# Patient Record
Sex: Female | Born: 1955 | Race: Black or African American | Hispanic: No | Marital: Married | Smoking: Light tobacco smoker
Health system: Southern US, Community
[De-identification: ages and names within clinical notes are randomized; demographics above are authoritative.]

## PROBLEM LIST (undated history)

## (undated) DIAGNOSIS — E669 Obesity, unspecified: Secondary | ICD-10-CM

## (undated) HISTORY — PX: BREAST BIOPSY: SHX20

## (undated) HISTORY — PX: TONSILLECTOMY: SUR1361

## (undated) HISTORY — PX: ABDOMINAL HYSTERECTOMY: SHX81

---

## 2008-08-11 ENCOUNTER — Ambulatory Visit: Payer: Self-pay

## 2013-03-25 HISTORY — PX: COLONOSCOPY: SHX174

## 2013-12-16 ENCOUNTER — Ambulatory Visit: Payer: Self-pay | Admitting: Otolaryngology

## 2015-08-25 ENCOUNTER — Encounter: Payer: Self-pay | Admitting: *Deleted

## 2015-08-28 ENCOUNTER — Ambulatory Visit (INDEPENDENT_AMBULATORY_CARE_PROVIDER_SITE_OTHER): Payer: 59 | Admitting: General Surgery

## 2015-08-28 ENCOUNTER — Encounter: Payer: Self-pay | Admitting: General Surgery

## 2015-08-28 VITALS — BP 122/74 | HR 80 | Resp 14 | Ht 66.0 in | Wt 179.0 lb

## 2015-08-28 DIAGNOSIS — N6452 Nipple discharge: Secondary | ICD-10-CM

## 2015-08-28 DIAGNOSIS — D249 Benign neoplasm of unspecified breast: Secondary | ICD-10-CM | POA: Insufficient documentation

## 2015-08-28 DIAGNOSIS — D242 Benign neoplasm of left breast: Secondary | ICD-10-CM | POA: Diagnosis not present

## 2015-08-28 NOTE — Patient Instructions (Addendum)
Patient to have a exision left breast .   Patient is scheduled for surgery at [facility] on 09/07/15. She will pre admit by phone. Patient is aware of date and instructions.

## 2015-08-28 NOTE — Progress Notes (Signed)
Patient ID: Melissa Phillips, female   DOB: 1955/04/20, 60 y.o.   MRN: [id number]  Chief Complaint  Patient presents with  . Other    left nipple discharge    HPI Melissa Phillips is a 60 y.o. female here today for a evaluation of left breast discharge. She states she noticed bloody discharge from her left nipple on 08/22/15. She denies any pain or discomfort from the breasts. She had her last mammogram on 06/15/15 at [facility]. She reports no past problems with her breasts. She is here today with her sister, Melissa Phillips.  The patient reports spontaneous drainage, usually at night. Multiple droplets on the bra/night clothes. She can express thin blood fluid with vigorous compression. No history of trauma. No previous history of bleeding.  I personally reviewed the patient's history. HPI  No past medical history on file.  Past Surgical History  Procedure Laterality Date  . Abdominal hysterectomy      total    Family History  Problem Relation Age of Onset  . Colon cancer Father 33  . Lung cancer Mother 77    Social History Social History  Substance Use Topics  . Smoking status: Never Smoker   . Smokeless tobacco: None  . Alcohol Use: No    No Known Allergies  Current Outpatient Prescriptions  Medication Sig Dispense Refill  . Cyanocobalamin (RA VITAMIN B-12 TR) 1000 MCG TBCR Take by mouth.    . loratadine (CLARITIN) 10 MG tablet Take 10 mg by mouth daily.    . phentermine 15 MG capsule Take 15 mg by mouth every morning.    . vitamin E (E-400) 400 UNIT capsule Take by mouth.     No current facility-administered medications for this visit.    Review of Systems Review of Systems  Constitutional: Negative.   Respiratory: Negative.   Cardiovascular: Negative.     Blood pressure 122/74, pulse 80, resp. rate 14, height 5\' 6"  (1.676 m), weight 179 lb (81.194 kg).  Physical Exam Physical Exam  Constitutional: She is oriented to person, place, and time. She appears  well-developed and well-nourished.  Eyes: Conjunctivae are normal. No scleral icterus.  Neck: Neck supple.  Cardiovascular: Normal rate, regular rhythm and normal heart sounds.   Pulmonary/Chest: Effort normal and breath sounds normal. Right breast exhibits no inverted nipple, no mass, no nipple discharge, no skin change and no tenderness. Left breast exhibits nipple discharge ( one duck at 11 oclock). Left breast exhibits no inverted nipple, no mass, no skin change and no tenderness.    Abdominal: Soft. Bowel sounds are normal. There is no tenderness.  Lymphadenopathy:    She has no cervical adenopathy.  Neurological: She is alert and oriented to person, place, and time.  Skin: Skin is warm.    Data Reviewed 06/15/2015 bilateral screening mammogram completed at [facility] showed dense breasts without area of mammographic concern. BI-RADS-1. Impressive density considering surgical menopause 20+ years ago.  Ultrasound examination of the left breast with special attention of the retroareolar area showed a 0.6 x 0.6 x 0.75 slightly elongated nodule with a central filling defect and a mixed pattern of enhancement/shadowing suggestive of a papilloma at the 2:00 position. BI-RADS 3.  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    Plan    Considering the persistent drainage rather than an incidental finding on imaging I recommended duct in the papilloma excision rather than core biopsy. Indications for surgery have been reviewed. Risks associated  with procedure including bleeding, pain and loss of nipple sensitivity were discussed.   Patient to have a excision at [facility] left breast  Patient is scheduled for surgery at [facility] on 09/07/15. She will pre admit by phone. Patient is aware of date and instructions.   PCP:  Maeola Sarah This information has been scribed by Gaspar Cola CMA.   Robert Bellow 08/28/2015, 10:03 PM

## 2015-08-28 NOTE — H&P (Signed)
HPI  Melissa Phillips is a 60 y.o. female here today for a evaluation of left breast discharge. She states she noticed bloody discharge from her left nipple on 08/22/15. She denies any pain or discomfort from the breasts. She had her last mammogram on 06/15/15 at [facility]. She reports no past problems with her breasts. She is here today with her sister, [name].  The patient reports spontaneous drainage, usually at night. Multiple droplets on the bra/night clothes. She can express thin blood fluid with vigorous compression. No history of trauma. No previous history of bleeding.  I personally reviewed the patient's history.  HPI  No past medical history on file.  Past Surgical History   Procedure  Laterality  Date   .  Abdominal hysterectomy       total    Family History   Problem  Relation  Age of Onset   .  Colon cancer  Father  55   .  Lung cancer  Mother  62    Social History  Social History   Substance Use Topics   .  Smoking status:  Never Smoker   .  Smokeless tobacco:  None   .  Alcohol Use:  No    No Known Allergies  Current Outpatient Prescriptions   Medication  Sig  Dispense  Refill   .  Cyanocobalamin (RA VITAMIN B-12 TR) 1000 MCG TBCR  Take by mouth.     .  loratadine (CLARITIN) 10 MG tablet  Take 10 mg by mouth daily.     .  phentermine 15 MG capsule  Take 15 mg by mouth every morning.     .  vitamin E (E-400) 400 UNIT capsule  Take by mouth.      No current facility-administered medications for this visit.    Review of Systems  Review of Systems  Constitutional: Negative.  Respiratory: Negative.  Cardiovascular: Negative.   Blood pressure 122/74, pulse 80, resp. rate 14, height 5\' 6"  (1.676 m), weight 179 lb (81.194 kg).  Physical Exam  Physical Exam  Constitutional: She is oriented to person, place, and time. She appears well-developed and well-nourished.  Eyes: Conjunctivae are normal. No scleral icterus.  Neck: Neck supple.  Cardiovascular: Normal  rate, regular rhythm and normal heart sounds.  Pulmonary/Chest: Effort normal and breath sounds normal. Right breast exhibits no inverted nipple, no mass, no nipple discharge, no skin change and no tenderness. Left breast exhibits nipple discharge ( one duck at 11 oclock). Left breast exhibits no inverted nipple, no mass, no skin change and no tenderness.    Abdominal: Soft. Bowel sounds are normal. There is no tenderness.  Lymphadenopathy:  She has no cervical adenopathy.  Neurological: She is alert and oriented to person, place, and time.  Skin: Skin is warm.   Data Reviewed  06/15/2015 bilateral screening mammogram completed at [facility] showed dense breasts without area of mammographic concern. BI-RADS-1. Impressive density considering surgical menopause 20+ years ago.  Ultrasound examination of the left breast with special attention of the retroareolar area showed a 0.6 x 0.6 x 0.75 slightly elongated nodule with a central filling defect and a mixed pattern of enhancement/shadowing suggestive of a papilloma at the 2:00 position. BI-RADS 3.  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   Plan   Considering the persistent drainage rather than an incidental finding on imaging I recommended duct in the papilloma excision rather than core biopsy.  Indications for surgery have  been reviewed. Risks associated with procedure including bleeding, pain and loss of nipple sensitivity were discussed.  Patient to have a excision at [facility] left breast  Patient is scheduled for surgery at [facility] on 09/07/15. She will pre admit by phone. Patient is aware of date and instructions.  PCP: Maeola Sarah  This information has been scribed by Gaspar Cola CMA.  Robert Bellow  08/28/2015, 10:03 PM

## 2015-08-30 ENCOUNTER — Encounter: Payer: Self-pay | Admitting: *Deleted

## 2015-08-30 ENCOUNTER — Other Ambulatory Visit: Payer: Self-pay | Admitting: General Surgery

## 2015-08-30 ENCOUNTER — Other Ambulatory Visit: Payer: Self-pay

## 2015-08-30 DIAGNOSIS — D242 Benign neoplasm of left breast: Secondary | ICD-10-CM

## 2015-08-30 NOTE — Patient Instructions (Signed)
  Your procedure is scheduled on: 09-07-15 Report to [place] To find out your arrival time please call [phone] between 1PM - 3PM on 09-06-15  Remember: Instructions that are not followed completely may result in serious medical risk, up to and including death, or upon the discretion of your surgeon and anesthesiologist your surgery may need to be rescheduled.    _X___ 1. Do not eat food or drink liquids after midnight. No gum chewing or hard candies.     _X___ 2. No Alcohol for 24 hours before or after surgery.   ____ 3. Bring all medications with you on the day of surgery if instructed.    ____ 4. Notify your doctor if there is any change in your medical condition     (cold, fever, infections).     Do not wear jewelry, make-up, hairpins, clips or nail polish.  Do not wear lotions, powders, or perfumes. You may wear deodorant.  Do not shave 48 hours prior to surgery. Men may shave face and neck.  Do not bring valuables to the hospital.    [facility] is not responsible for any belongings or valuables.               Contacts, dentures or bridgework may not be worn into surgery.  Leave your suitcase in the car. After surgery it may be brought to your room.  For patients admitted to the hospital, discharge time is determined by your treatment team.   Patients discharged the day of surgery will not be allowed to drive home.   Please read over the following fact sheets that you were given:     ____ Take these medicines the morning of surgery with A SIP OF WATER:    1.NONE  2.   3.   4.  5.  6.  ____ Fleet Enema (as directed)   ____ Use CHG Soap as directed  ____ Use inhalers on the day of surgery  ____ Stop metformin 2 days prior to surgery    ____ Take 1/2 of usual insulin dose the night before surgery and none on the morning of surgery.   ____ Stop Coumadin/Plavix/aspirin-N/A  ____ Stop Anti-inflammatories   ____ Stop supplements  until after surgery-STOP VITAMIN E NOW  AND PHENTERMINE-CHECK WITH [facility] C[facility] TO MAKE SURE IT IS OK TO STOP PHENTERMINE SUDDENLY SINCE YOU ARE ON A REGIMEN OF RECEIVING HCG SHOTS 2 X WEEK   ____ Bring C-Pap to the hospital.

## 2015-08-30 NOTE — H&P (Signed)
HPI Melissa Phillips is a 60 y.o. female here today for a evaluation of left breast discharge. She states she noticed bloody discharge from her left nipple on 08/22/15. She denies any pain or discomfort from the breasts. She had her last mammogram on 06/15/15 at [facility]. She reports no past problems with her breasts. She is here today with her sister, Melissa Phillips.  The patient reports spontaneous drainage, usually at night. Multiple droplets on the bra/night clothes. She can express thin blood fluid with vigorous compression. No history of trauma. No previous history of bleeding.  I personally reviewed the patient's history. HPI  No past medical history on file.  Past Surgical History  Procedure Laterality Date  . Abdominal hysterectomy      total    Family History  Problem Relation Age of Onset  . Colon cancer Father 50  . Lung cancer Mother 21    Social History Social History  Substance Use Topics  . Smoking status: Never Smoker   . Smokeless tobacco: None  . Alcohol Use: No    No Known Allergies  Current Outpatient Prescriptions  Medication Sig Dispense Refill  . Cyanocobalamin (RA VITAMIN B-12 TR) 1000 MCG TBCR Take by mouth.    . loratadine (CLARITIN) 10 MG tablet Take 10 mg by mouth daily.    . phentermine 15 MG capsule Take 15 mg by mouth every morning.    . vitamin E (E-400) 400 UNIT capsule Take by mouth.     No current facility-administered medications for this visit.    Review of Systems Review of Systems  Constitutional: Negative.  Respiratory: Negative.  Cardiovascular: Negative.    Blood pressure 122/74, pulse 80, resp. rate 14, height 5\' 6"  (1.676 m), weight 179 lb (81.194 kg).  Physical Exam Physical Exam  Constitutional: She is oriented to person, place, and time. She appears well-developed and well-nourished.  Eyes: Conjunctivae are normal. No scleral icterus.  Neck: Neck  supple.  Cardiovascular: Normal rate, regular rhythm and normal heart sounds.  Pulmonary/Chest: Effort normal and breath sounds normal. Right breast exhibits no inverted nipple, no mass, no nipple discharge, no skin change and no tenderness. Left breast exhibits nipple discharge ( one duck at 11 oclock). Left breast exhibits no inverted nipple, no mass, no skin change and no tenderness.    Abdominal: Soft. Bowel sounds are normal. There is no tenderness.  Lymphadenopathy:   She has no cervical adenopathy.  Neurological: She is alert and oriented to person, place, and time.  Skin: Skin is warm.    Data Reviewed 06/15/2015 bilateral screening mammogram completed at [place] showed dense breasts without area of mammographic concern. BI-RADS-1. Impressive density considering surgical menopause 20+ years ago.  Ultrasound examination of the left breast with special attention of the retroareolar area showed a 0.6 x 0.6 x 0.75 slightly elongated nodule with a central filling defect and a mixed pattern of enhancement/shadowing suggestive of a papilloma at the 2:00 position. BI-RADS 3.  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    Plan    Considering the persistent drainage rather than an incidental finding on imaging I recommended duct in the papilloma excision rather than core biopsy. Indications for surgery have been reviewed. Risks associated with procedure including bleeding, pain and loss of nipple sensitivity were discussed.   Patient to have a excision at [facility] left breast  Patient is scheduled for surgery at [facility] on 09/07/15. She will pre admit by phone. Patient  is aware of date and instructions.   PCP: Melissa Phillips This information has been scribed by Gaspar Cola CMA.   Robert Bellow 08/28/2015, 10:03 PM

## 2015-09-07 ENCOUNTER — Encounter
Admission: RE | Disposition: A | Discharge status: Home / Self Care | Payer: Self-pay | Source: Ambulatory Visit | Attending: General Surgery

## 2015-09-07 ENCOUNTER — Ambulatory Visit: Payer: 59 | Admitting: Anesthesiology

## 2015-09-07 ENCOUNTER — Ambulatory Visit
Admission: RE | Admit: 2015-09-07 | Discharge: 2015-09-07 | Disposition: A | Discharge status: Home / Self Care | Payer: 59 | Source: Ambulatory Visit | Attending: General Surgery | Admitting: General Surgery

## 2015-09-07 ENCOUNTER — Encounter: Payer: Self-pay | Admitting: *Deleted

## 2015-09-07 DIAGNOSIS — D242 Benign neoplasm of left breast: Secondary | ICD-10-CM | POA: Insufficient documentation

## 2015-09-07 DIAGNOSIS — N61 Mastitis without abscess: Secondary | ICD-10-CM | POA: Insufficient documentation

## 2015-09-07 DIAGNOSIS — D493 Neoplasm of unspecified behavior of breast: Secondary | ICD-10-CM | POA: Diagnosis not present

## 2015-09-07 HISTORY — PX: BREAST DUCTAL SYSTEM EXCISION: SHX5242

## 2015-09-07 HISTORY — DX: Obesity, unspecified: E66.9

## 2015-09-07 SURGERY — EXCISION DUCTAL SYSTEM BREAST
Anesthesia: General | Site: Breast | Laterality: Left | Wound class: Clean

## 2015-09-07 MED ORDER — FENTANYL CITRATE (PF) 100 MCG/2ML IJ SOLN
INTRAMUSCULAR | Status: DC | PRN
  Administered 2015-09-07: 100 ug via INTRAVENOUS

## 2015-09-07 MED ORDER — CHLORHEXIDINE GLUCONATE 4 % EX LIQD: 1.0000 "application " | Freq: Once | CUTANEOUS | Status: DC

## 2015-09-07 MED ORDER — DEXAMETHASONE SODIUM PHOSPHATE 10 MG/ML IJ SOLN
INTRAMUSCULAR | Status: DC | PRN
  Administered 2015-09-07: 5 mg via INTRAVENOUS

## 2015-09-07 MED ORDER — EPHEDRINE SULFATE 50 MG/ML IJ SOLN
INTRAMUSCULAR | Status: DC | PRN
  Administered 2015-09-07: 5 mg via INTRAVENOUS
  Administered 2015-09-07 (×3): 10 mg via INTRAVENOUS

## 2015-09-07 MED ORDER — FAMOTIDINE 20 MG PO TABS
ORAL_TABLET | ORAL | Status: DC
Start: 2015-09-07 — End: 2015-09-07
  Filled 2015-09-07: qty 1

## 2015-09-07 MED ORDER — LACTATED RINGERS IV SOLN
INTRAVENOUS | Status: DC
  Administered 2015-09-07 (×2): via INTRAVENOUS

## 2015-09-07 MED ORDER — HYDROMORPHONE HCL 1 MG/ML IJ SOLN
INTRAMUSCULAR | Status: DC | PRN
  Administered 2015-09-07: 0.5 mg via INTRAVENOUS

## 2015-09-07 MED ORDER — BUPIVACAINE-EPINEPHRINE (PF) 0.5% -1:200000 IJ SOLN
INTRAMUSCULAR | Status: DC | PRN
  Administered 2015-09-07: 30 mL

## 2015-09-07 MED ORDER — CEFAZOLIN SODIUM-DEXTROSE 2-4 GM/100ML-% IV SOLN
2.0000 g | INTRAVENOUS | Status: AC
  Administered 2015-09-07: 2 g via INTRAVENOUS

## 2015-09-07 MED ORDER — ACETAMINOPHEN 10 MG/ML IV SOLN
INTRAVENOUS | Status: AC
  Filled 2015-09-07: qty 100

## 2015-09-07 MED ORDER — FAMOTIDINE 20 MG PO TABS
20.0000 mg | ORAL_TABLET | Freq: Once | ORAL | Status: AC
  Administered 2015-09-07: 20 mg via ORAL

## 2015-09-07 MED ORDER — BUPIVACAINE-EPINEPHRINE (PF) 0.5% -1:200000 IJ SOLN
INTRAMUSCULAR | Status: AC
  Filled 2015-09-07: qty 30

## 2015-09-07 MED ORDER — LIDOCAINE HCL (CARDIAC) 20 MG/ML IV SOLN
INTRAVENOUS | Status: DC | PRN
  Administered 2015-09-07: 100 mg via INTRAVENOUS

## 2015-09-07 MED ORDER — ACETAMINOPHEN 10 MG/ML IV SOLN
INTRAVENOUS | Status: DC | PRN
  Administered 2015-09-07: 1000 mg via INTRAVENOUS

## 2015-09-07 MED ORDER — FENTANYL CITRATE (PF) 100 MCG/2ML IJ SOLN: 25.0000 ug | INTRAMUSCULAR | Status: DC | PRN

## 2015-09-07 MED ORDER — CEFAZOLIN SODIUM-DEXTROSE 2-4 GM/100ML-% IV SOLN
INTRAVENOUS | Status: AC
  Filled 2015-09-07: qty 100

## 2015-09-07 MED ORDER — METHYLENE BLUE 0.5 % INJ SOLN
INTRAVENOUS | Status: AC
  Filled 2015-09-07: qty 10

## 2015-09-07 MED ORDER — ONDANSETRON HCL 4 MG/2ML IJ SOLN: 4.0000 mg | Freq: Once | INTRAMUSCULAR | Status: DC | PRN

## 2015-09-07 MED ORDER — MIDAZOLAM HCL 2 MG/2ML IJ SOLN
INTRAMUSCULAR | Status: DC | PRN
  Administered 2015-09-07: 2 mg via INTRAVENOUS

## 2015-09-07 MED ORDER — ONDANSETRON HCL 4 MG/2ML IJ SOLN
INTRAMUSCULAR | Status: DC | PRN
  Administered 2015-09-07: 4 mg via INTRAVENOUS

## 2015-09-07 MED ORDER — KETOROLAC TROMETHAMINE 30 MG/ML IJ SOLN
INTRAMUSCULAR | Status: DC | PRN
  Administered 2015-09-07: 30 mg via INTRAVENOUS

## 2015-09-07 MED ORDER — PROPOFOL 10 MG/ML IV BOLUS
INTRAVENOUS | Status: DC | PRN
  Administered 2015-09-07: 150 mg via INTRAVENOUS
  Administered 2015-09-07: 10 mg via INTRAVENOUS

## 2015-09-07 MED ORDER — HYDROCODONE-ACETAMINOPHEN 5-325 MG PO TABS: 1.0000 | ORAL_TABLET | ORAL | Status: DC | PRN

## 2015-09-07 SURGICAL SUPPLY — 52 items
APPLIER CLIP 11 MED OPEN (CLIP)
APPLIER CLIP 13 LRG OPEN (CLIP)
BANDAGE ELASTIC 6 LF NS (GAUZE/BANDAGES/DRESSINGS) ×2 IMPLANT
BLADE SURG 15 STRL SS SAFETY (BLADE) ×2 IMPLANT
BNDG GAUZE 4.5X4.1 6PLY STRL (MISCELLANEOUS) ×2 IMPLANT
BULB RESERV EVAC DRAIN JP 100C (MISCELLANEOUS) IMPLANT
CANISTER SUCT 1200ML W/VALVE (MISCELLANEOUS) ×2 IMPLANT
CHLORAPREP W/TINT 26ML (MISCELLANEOUS) ×2 IMPLANT
CLIP APPLIE 11 MED OPEN (CLIP) IMPLANT
CLIP APPLIE 13 LRG OPEN (CLIP) IMPLANT
CNTNR SPEC 2.5X3XGRAD LEK (MISCELLANEOUS) ×2
CONT SPEC 4OZ STER OR WHT (MISCELLANEOUS) ×2
CONTAINER SPEC 2.5X3XGRAD LEK (MISCELLANEOUS) ×2 IMPLANT
DEVICE DUBIN SPECIMEN MAMMOGRA (MISCELLANEOUS) IMPLANT
DRAIN CHANNEL JP 15F RND 16 (MISCELLANEOUS) IMPLANT
DRAPE LAPAROTOMY TRNSV 106X77 (MISCELLANEOUS) ×2 IMPLANT
DRSG TELFA 3X8 NADH (GAUZE/BANDAGES/DRESSINGS) ×2 IMPLANT
ELECT CAUTERY BLADE TIP 2.5 (TIP) ×2
ELECT REM PT RETURN 9FT ADLT (ELECTROSURGICAL) ×2
ELECTRODE CAUTERY BLDE TIP 2.5 (TIP) ×1 IMPLANT
ELECTRODE REM PT RTRN 9FT ADLT (ELECTROSURGICAL) ×1 IMPLANT
GAUZE FLUFF 18X24 1PLY STRL (GAUZE/BANDAGES/DRESSINGS) ×2 IMPLANT
GAUZE SPONGE 4X4 12PLY STRL (GAUZE/BANDAGES/DRESSINGS) IMPLANT
GLOVE BIO SURGEON STRL SZ7.5 (GLOVE) ×6 IMPLANT
GLOVE INDICATOR 8.0 STRL GRN (GLOVE) ×6 IMPLANT
GOWN STRL REUS W/ TWL LRG LVL3 (GOWN DISPOSABLE) ×3 IMPLANT
GOWN STRL REUS W/TWL LRG LVL3 (GOWN DISPOSABLE) ×3
KIT RM TURNOVER STRD PROC AR (KITS) ×2 IMPLANT
LABEL OR SOLS (LABEL) ×2 IMPLANT
NEEDLE HYPO 25X1 1.5 SAFETY (NEEDLE) ×2 IMPLANT
PACK BASIN MINOR ARMC (MISCELLANEOUS) ×2 IMPLANT
PIN SAFETY STRL (MISCELLANEOUS) IMPLANT
SHEARS FOC LG CVD HARMONIC 17C (MISCELLANEOUS) IMPLANT
SLEVE PROBE SENORX GAMMA FIND (MISCELLANEOUS) IMPLANT
SPONGE LAP 18X18 5 PK (GAUZE/BANDAGES/DRESSINGS) IMPLANT
STRIP CLOSURE SKIN 1/2X4 (GAUZE/BANDAGES/DRESSINGS) ×2 IMPLANT
SUT ETHILON 3-0 FS-10 30 BLK (SUTURE) ×2
SUT SILK 0 (SUTURE)
SUT SILK 0 30XBRD TIE 6 (SUTURE) IMPLANT
SUT SILK 3 0 (SUTURE) ×1
SUT SILK 3-0 18XBRD TIE 12 (SUTURE) ×1 IMPLANT
SUT VIC AB 2-0 CT1 27 (SUTURE) ×1
SUT VIC AB 2-0 CT1 TAPERPNT 27 (SUTURE) ×1 IMPLANT
SUT VIC AB 2-0 CT2 27 (SUTURE) IMPLANT
SUT VIC AB 3-0 SH 27 (SUTURE) ×1
SUT VIC AB 3-0 SH 27X BRD (SUTURE) ×1 IMPLANT
SUT VIC AB 4-0 FS2 27 (SUTURE) ×2 IMPLANT
SUT VICRYL+ 3-0 144IN (SUTURE) ×2 IMPLANT
SUTURE EHLN 3-0 FS-10 30 BLK (SUTURE) ×1 IMPLANT
SWABSTK COMLB BENZOIN TINCTURE (MISCELLANEOUS) ×2 IMPLANT
SYR CONTROL 10ML (SYRINGE) ×2 IMPLANT
TAPE TRANSPORE STRL 2 31045 (GAUZE/BANDAGES/DRESSINGS) IMPLANT

## 2015-09-07 NOTE — Anesthesia Procedure Notes (Signed)
Procedure Name: LMA Insertion Date/Time: 09/07/2015 11:51 AM Performed by: Justus Memory Pre-anesthesia Checklist: Patient identified, Emergency Drugs available, Suction available and Patient being monitored Patient Re-evaluated:Patient Re-evaluated prior to inductionOxygen Delivery Method: Circle system utilized Preoxygenation: Pre-oxygenation with 100% oxygen Intubation Type: IV induction Ventilation: Mask ventilation without difficulty LMA: LMA inserted LMA Size: 3.5 Placement Confirmation: positive ETCO2 and breath sounds checked- equal and bilateral

## 2015-09-07 NOTE — Anesthesia Postprocedure Evaluation (Signed)
Anesthesia Post Note  Patient: Melissa Phillips  Procedure(s) Performed: Procedure(s) (LRB): EXCISION PAPILLOMA  (Left)  Patient location during evaluation: PACU Anesthesia Type: General Level of consciousness: awake and alert Pain management: pain level controlled Vital Signs Assessment: post-procedure vital signs reviewed and stable Respiratory status: spontaneous breathing and respiratory function stable Cardiovascular status: stable Anesthetic complications: no    Last Vitals:  Filed Vitals:   09/07/15 1302 09/07/15 1318  BP: 123/70 131/63  Pulse: 79 72  Temp:    Resp: 19 14    Last Pain:  Filed Vitals:   09/07/15 1319  PainSc: Asleep                 KEPHART,WILLIAM K

## 2015-09-07 NOTE — Op Note (Signed)
Preoperative diagnosis: Left breast papilloma.  Postoperative diagnosis: Same.  Operative procedure: Excision of left breast papilloma.  Operating surgeon: Ollen Bowl, M.D.  Anesthesia: Gen. by LMA, Marcaine 0.5% with 1 200,000 epinephrine, 30 mL.  Estimated blood loss: 10 mL.  Clinical note: This 60 year old woman developed left nipple drainage. Ultrasound suggested a papilloma to 2:00 position. She is brought to the operative for planned excision. She received Kefzol prior to the procedure.  Operative note: With the patient under adequate general anesthesia the area was prepped with ChloraPrep and draped. Marcaine was infiltrated for postoperative analgesia. Ultrasound was used to confirm the location of the suspected papilloma the 2:00 position just at the edge areola. The nipple duct with bloody drainage was cannulated with a thin lacrimal duct probe. A curvilinear incision from the 12:00 position was carried down through skin and subcutaneous anesthesia with hemostasis achieved by electrocautery. The tissue was dissected underneath the areola towards the nipple. The previously placed cannula was identified and the duct excised at the base the nipple. A 2 x 3 x 3 cm block of tissue was excised as well as an additional 1 cm lateral block. This was oriented and sent for routine histology. The wound was irrigated and hemostasis achieved with electrocautery. The wound was approximated with layers of 2-0 Vicryl figure-of-eight suture. The skin was closed with a running 4-0 Vicryl subcuticular suture. Benzoin, Steri-Strips, Telfa, fluff gauze, Kerlix and Ace wrap was applied.  The patient tolerated the procedure well and was taken to recovery in stable condition.

## 2015-09-07 NOTE — H&P (Signed)
No change in clinical history or exam.   For left breast papilloma excision.

## 2015-09-07 NOTE — Transfer of Care (Signed)
Immediate Anesthesia Transfer of Care Note  Patient: Melissa Phillips  Procedure(s) Performed: Procedure(s): EXCISION PAPILLOMA  (Left)  Patient Location: PACU  Anesthesia Type:General  Level of Consciousness: sedated  Airway & Oxygen Therapy: Patient Spontanous Breathing and Patient connected to face mask oxygen  Post-op Assessment: Report given to RN  Post vital signs: Reviewed and stable  Last Vitals:  Filed Vitals:   09/07/15 1041 09/07/15 1247  BP: 137/88 114/59  Pulse: 77 70  Temp: 36.7 C 36.3 C  Resp: 16 14    Last Pain:  Filed Vitals:   09/07/15 1250  PainSc: Asleep         Complications: No apparent anesthesia complications

## 2015-09-07 NOTE — Anesthesia Preprocedure Evaluation (Signed)
Anesthesia Evaluation  Patient identified by MRN, date of birth, ID band Patient awake    Reviewed: Allergy & Precautions, NPO status , Patient's Chart, lab work & pertinent test results  History of Anesthesia Complications Negative for: history of anesthetic complications  Airway Mallampati: II       Dental   Pulmonary neg pulmonary ROS,           Cardiovascular negative cardio ROS       Neuro/Psych negative neurological ROS  negative psych ROS   GI/Hepatic Neg liver ROS,   Endo/Other  negative endocrine ROS  Renal/GU negative Renal ROS     Musculoskeletal   Abdominal   Peds  Hematology negative hematology ROS (+)   Anesthesia Other Findings   Reproductive/Obstetrics                             Anesthesia Physical Anesthesia Plan  ASA: II  Anesthesia Plan: General   Post-op Pain Management:    Induction: Intravenous  Airway Management Planned: LMA  Additional Equipment:   Intra-op Plan:   Post-operative Plan:   Informed Consent: I have reviewed the patients History and Physical, chart, labs and discussed the procedure including the risks, benefits and alternatives for the proposed anesthesia with the patient or authorized representative who has indicated his/her understanding and acceptance.     Plan Discussed with:   Anesthesia Plan Comments:         Anesthesia Quick Evaluation

## 2015-09-08 ENCOUNTER — Encounter: Payer: Self-pay | Admitting: General Surgery

## 2015-09-08 LAB — SURGICAL PATHOLOGY

## 2015-09-10 ENCOUNTER — Telehealth: Payer: Self-pay | Admitting: General Surgery

## 2015-09-10 NOTE — Telephone Encounter (Signed)
Notified pathology was benign. Reports doing well. Follow up later in the week as scheduled.

## 2015-09-11 ENCOUNTER — Encounter: Payer: Self-pay | Admitting: *Deleted

## 2015-09-14 ENCOUNTER — Ambulatory Visit (INDEPENDENT_AMBULATORY_CARE_PROVIDER_SITE_OTHER): Payer: 59 | Admitting: General Surgery

## 2015-09-14 ENCOUNTER — Encounter: Payer: Self-pay | Admitting: General Surgery

## 2015-09-14 VITALS — BP 120/78 | HR 63 | Resp 14 | Ht 66.0 in | Wt 177.0 lb

## 2015-09-14 DIAGNOSIS — D242 Benign neoplasm of left breast: Secondary | ICD-10-CM

## 2015-09-14 NOTE — Patient Instructions (Signed)
Wear a bra day and night until you breast does not feel heavy.

## 2015-09-14 NOTE — Progress Notes (Signed)
Patient ID: Melissa Phillips, female   DOB: 01/01/1956, 60 y.o.   MRN: [id number]  Chief Complaint  Patient presents with  . Routine Post Op    HPI Melissa Phillips is a 60 y.o. female here for a follow up from a left breast papilloma excision done on 09/07/15. She reports that she is doing very well. No pain or discomfort.   The primary area was excised as well as a small adjacent area laterally of thickened tissue. She reports no pain since the procedure.  I person reviewed the patient's history. HPI  Past Medical History  Diagnosis Date  . Obesity     Past Surgical History  Procedure Laterality Date  . Abdominal hysterectomy      total  . Tonsillectomy    . Breast ductal system excision Left 09/07/2015    Procedure: EXCISION PAPILLOMA ;  Surgeon: Robert Bellow, MD;  Location: [facility] ORS;  Service: General;  Laterality: Left;    Family History  Problem Relation Age of Onset  . Colon cancer Father 71  . Lung cancer Mother 42    Social History Social History  Substance Use Topics  . Smoking status: Never Smoker   . Smokeless tobacco: Never Used  . Alcohol Use: No    No Known Allergies  Current Outpatient Prescriptions  Medication Sig Dispense Refill  . Cyanocobalamin (VITAMIN B-12 IJ) Inject 1 Dose as directed 2 (two) times a week. PT IS HAVING THIS DONE THRU PHYSICIAN [facility] IN COMBINATION WITH PHENTERMINE AND HCG SHOTS 2X WEEKLY    . human chorionic gonadotropin (PREGNYL/NOVAREL) 10000 units injection Inject 1 Units into the muscle 2 (two) times a week. PT HAVING THIS DONE THRU PHYSICIAN [facility] IN COMBINATION WITH PHENTERMINE AND B 12 INJECTIONS    . loratadine (CLARITIN) 10 MG tablet Take 10 mg by mouth daily.    . phentermine 37.5 MG capsule Take 37.5 mg by mouth every morning.    . vitamin E (E-400) 400 UNIT capsule Take by mouth.     No current facility-administered medications for this visit.    Review of Systems Review of Systems   Constitutional: Negative.   Respiratory: Negative.   Cardiovascular: Negative.     Blood pressure 120/78, pulse 63, resp. rate 14, height 5\' 6"  (1.676 m), weight 177 lb (80.287 kg).  Physical Exam Physical Exam  Constitutional: She appears well-developed and well-nourished.  Pulmonary/Chest: Left breast exhibits no nipple discharge and no tenderness.      Data Reviewed DIAGNOSIS:  A. 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  - BACKGROUND FIBROCYSTIC CHANGE AND CHRONIC INFLAMMATION.   B. LEFT BREAST, LATERAL TISSUE; EXCISION:  - FOCAL FIBROCYSTIC CHANGE AND CHRONIC INFLAMMATION.   Assessment    Doing well status post excision left breast papilloma.    Plan    The patient has been asked to return in one month for final exam. She will continue to wear her bra day and night until the heaviness in the breast resolves.     This has been scribed by Lesly Rubenstein LPN PCP: Lanice Shirts 09/14/2015, 1:52 PM

## 2015-10-11 ENCOUNTER — Ambulatory Visit (INDEPENDENT_AMBULATORY_CARE_PROVIDER_SITE_OTHER): Payer: 59 | Admitting: General Surgery

## 2015-10-11 DIAGNOSIS — D242 Benign neoplasm of left breast: Secondary | ICD-10-CM

## 2015-10-11 NOTE — Progress Notes (Signed)
Patient ID: Melissa Phillips, female   DOB: 1955-06-12, 60 y.o.   MRN: [id number]  Chief Complaint  Patient presents with  . Follow-up    excision left breast    HPI [name] is a 60 y.o. female here for a follow up from a left breast papilloma excision done on 09/07/15. She reports that she is doing very well. No pain or discomfort.  HPI  Past Medical History  Diagnosis Date  . Obesity     Past Surgical History  Procedure Laterality Date  . Abdominal hysterectomy      total  . Tonsillectomy    . Breast ductal system excision Left 09/07/2015    Procedure: EXCISION PAPILLOMA ;  Surgeon: Robert Bellow, MD;  Location: [facility] ORS;  Service: General;  Laterality: Left;    Family History  Problem Relation Age of Onset  . Colon cancer Father 57  . Lung cancer Mother 77    Social History Social History  Substance Use Topics  . Smoking status: Never Smoker   . Smokeless tobacco: Never Used  . Alcohol Use: No    No Known Allergies  Current Outpatient Prescriptions  Medication Sig Dispense Refill  . Cyanocobalamin (VITAMIN B-12 IJ) Inject 1 Dose as directed 2 (two) times a week. PT IS HAVING THIS DONE THRU PHYSICIAN WEIGHT LOSS IN COMBINATION WITH PHENTERMINE AND HCG SHOTS 2X WEEKLY    . human chorionic gonadotropin (PREGNYL/NOVAREL) 10000 units injection Inject 1 Units into the muscle 2 (two) times a week. PT HAVING THIS DONE THRU PHYSICIAN WEIGHT LOSS IN COMBINATION WITH PHENTERMINE AND B 12 INJECTIONS    . loratadine (CLARITIN) 10 MG tablet Take 10 mg by mouth daily.    . phentermine 37.5 MG capsule Take 37.5 mg by mouth every morning.    . vitamin E (E-400) 400 UNIT capsule Take by mouth.     No current facility-administered medications for this visit.    Review of Systems Review of Systems  Constitutional: Negative.   Respiratory: Negative.   Cardiovascular: Negative.     There were no vitals taken for this visit.  Physical Exam Physical Exam   Constitutional: She appears well-developed and well-nourished.  Eyes: Conjunctivae are normal. No scleral icterus.  Neck: Neck supple.  Cardiovascular: Normal rate, regular rhythm and normal heart sounds.   Pulmonary/Chest: Effort normal and breath sounds normal.  Abdominal: Soft. Bowel sounds are normal.  Lymphadenopathy:    She has no cervical adenopathy.  Neurological: She is alert.  Skin: Skin is warm and dry.      Assessment    Aborted visit secondary to building evacuation.    Plan    Reschedule    PCP:  [place] This information has been scribed by Gaspar Cola CMA.    Gaspar Cola 10/11/2015, 9:37 AM

## 2015-10-19 ENCOUNTER — Ambulatory Visit (INDEPENDENT_AMBULATORY_CARE_PROVIDER_SITE_OTHER): Payer: 59 | Admitting: General Surgery

## 2015-10-19 ENCOUNTER — Encounter: Payer: Self-pay | Admitting: General Surgery

## 2015-10-19 VITALS — BP 120/72 | HR 78 | Resp 12 | Ht 66.0 in | Wt 176.0 lb

## 2015-10-19 DIAGNOSIS — D242 Benign neoplasm of left breast: Secondary | ICD-10-CM

## 2015-10-19 NOTE — Progress Notes (Signed)
Patient ID: Melissa Phillips, female   DOB: 1955-12-27, 60 y.o.   MRN: [id number]  Chief Complaint  Patient presents with  . Routine Post Op    left breast excision     HPI Melissa Phillips is a 60 y.o. female.  Here today for follow up visit, excision papilloma left breast 09-07-15. She states she is doing well.   HPI  Past Medical History:  Diagnosis Date  . Obesity     Past Surgical History:  Procedure Laterality Date  . ABDOMINAL HYSTERECTOMY     total  . BREAST DUCTAL SYSTEM EXCISION Left 09/07/2015   Procedure: EXCISION PAPILLOMA ;  Surgeon: Robert Bellow, MD;  Location: [facility] ORS;  Service: General;  Laterality: Left;  . COLONOSCOPY  2015  . TONSILLECTOMY      Family History  Problem Relation Age of Onset  . Colon cancer Father 6  . Lung cancer Mother 77    Social History Social History  Substance Use Topics  . Smoking status: Never Smoker  . Smokeless tobacco: Never Used  . Alcohol use No    No Known Allergies  Current Outpatient Prescriptions  Medication Sig Dispense Refill  . Cyanocobalamin (VITAMIN B-12 IJ) Inject 1 Dose as directed 2 (two) times a week. PT IS HAVING THIS DONE THRU PHYSICIAN WEIGHT LOSS IN COMBINATION WITH PHENTERMINE AND HCG SHOTS 2X WEEKLY    . human chorionic gonadotropin (PREGNYL/NOVAREL) 10000 units injection Inject 1 Units into the muscle 2 (two) times a week. PT HAVING THIS DONE THRU PHYSICIAN WEIGHT LOSS IN COMBINATION WITH PHENTERMINE AND B 12 INJECTIONS    . loratadine (CLARITIN) 10 MG tablet Take 10 mg by mouth daily.    . phentermine 37.5 MG capsule Take 37.5 mg by mouth every morning.    . vitamin E (E-400) 400 UNIT capsule Take by mouth.     No current facility-administered medications for this visit.     Review of Systems Review of Systems  Constitutional: Negative.   Respiratory: Negative.   Cardiovascular: Negative.     Blood pressure 120/72, pulse 78, resp. rate 12, height 5\' 6"  (1.676 m), weight 176 lb  (79.8 kg).  Physical Exam Physical Exam  Constitutional: She is oriented to person, place, and time. She appears well-developed and well-nourished.  Pulmonary/Chest:    Left breast incision well healed.  Neurological: She is alert and oriented to person, place, and time.  Skin: Skin is warm and dry.  Psychiatric: Her behavior is normal.    Data Reviewed Papilloma without atypia.  Assessment    Resolution of nipple drainage with excision of intraductal papilloma.    Plan         Follow up as needed. Annual mammograms through primary care physician. The patient is aware to call back for any questions or concerns.      This information has been scribed by Karie Fetch RN, BSN,BC.   Robert Bellow 10/19/2015, 10:09 AM

## 2015-10-19 NOTE — Patient Instructions (Signed)
The patient is aware to call back for any questions or concerns.  

## 2017-09-08 ENCOUNTER — Ambulatory Visit: Payer: 59 | Admitting: Obstetrics and Gynecology

## 2017-09-30 ENCOUNTER — Ambulatory Visit (INDEPENDENT_AMBULATORY_CARE_PROVIDER_SITE_OTHER): Payer: 59 | Admitting: Obstetrics and Gynecology

## 2017-09-30 ENCOUNTER — Encounter: Payer: Self-pay | Admitting: Obstetrics and Gynecology

## 2017-09-30 VITALS — BP 136/84 | HR 69 | Ht 66.0 in | Wt 184.0 lb

## 2017-09-30 DIAGNOSIS — K640 First degree hemorrhoids: Secondary | ICD-10-CM | POA: Diagnosis not present

## 2017-09-30 DIAGNOSIS — Z0000 Encounter for general adult medical examination without abnormal findings: Secondary | ICD-10-CM

## 2017-09-30 DIAGNOSIS — Z13820 Encounter for screening for osteoporosis: Secondary | ICD-10-CM

## 2017-09-30 DIAGNOSIS — Z1231 Encounter for screening mammogram for malignant neoplasm of breast: Secondary | ICD-10-CM

## 2017-09-30 DIAGNOSIS — Z124 Encounter for screening for malignant neoplasm of cervix: Secondary | ICD-10-CM

## 2017-09-30 DIAGNOSIS — Z1239 Encounter for other screening for malignant neoplasm of breast: Secondary | ICD-10-CM

## 2017-09-30 DIAGNOSIS — Z01411 Encounter for gynecological examination (general) (routine) with abnormal findings: Secondary | ICD-10-CM | POA: Diagnosis not present

## 2017-09-30 DIAGNOSIS — Z01419 Encounter for gynecological examination (general) (routine) without abnormal findings: Secondary | ICD-10-CM

## 2017-09-30 NOTE — Progress Notes (Signed)
Gynecology Annual Exam  PCP: Maeola Sarah, MD  Chief Complaint:  Chief Complaint  Patient presents with  . Gynecologic Exam    Hemorrhoids    History of Present Illness:Patient is a 62 y.o. [id number] presents for annual exam. The patient has no complaints today.   LMP: No LMP recorded. Patient has had a hysterectomy. Intermenstrual Bleeding: not applicable Postcoital Bleeding: no Dysmenorrhea: not applicable  The patient is sexually active. She denies dyspareunia.  The patient does perform self breast exams.  There is no notable family history of breast or ovarian cancer in her family.  The patient wears seatbelts: yes.   The patient has regular exercise: yes.    The patient denies current symptoms of depression.     Review of Systems: Review of Systems  Constitutional: Negative for chills, fever, malaise/fatigue and weight loss.  HENT: Negative for congestion, hearing loss and sinus pain.   Eyes: Negative for blurred vision and double vision.  Respiratory: Negative for cough, sputum production, shortness of breath and wheezing.   Cardiovascular: Negative for chest pain, palpitations, orthopnea and leg swelling.  Gastrointestinal: Negative for abdominal pain, constipation, diarrhea, nausea and vomiting.  Genitourinary: Negative for dysuria, flank pain, frequency, hematuria and urgency.  Musculoskeletal: Negative for back pain, falls and joint pain.  Skin: Negative for itching and rash.  Neurological: Negative for dizziness and headaches.  Psychiatric/Behavioral: Negative for depression, substance abuse and suicidal ideas. The patient is not nervous/anxious.     Past Medical History:  Past Medical History:  Diagnosis Date  . Obesity     Past Surgical History:  Past Surgical History:  Procedure Laterality Date  . ABDOMINAL HYSTERECTOMY     total  . BREAST DUCTAL SYSTEM EXCISION Left 09/07/2015   Procedure: EXCISION PAPILLOMA ;  Surgeon: Robert Bellow, MD;   Location: [facility] ORS;  Service: General;  Laterality: Left;  . COLONOSCOPY  2015  . TONSILLECTOMY      Gynecologic History:  No LMP recorded. Patient has had a hysterectomy. Last Pap: Results were: NIL no abnormalities  Last mammogram: 2017 Results were: BI-RAD I  Obstetric History: [id number]  Family History:  Family History  Problem Relation Age of Onset  . Colon cancer Father 68  . Lung cancer Mother 101  . Lung cancer Sister     Social History:  Social History   Socioeconomic History  . Marital status: Married    Spouse name: Not on file  . Number of children: Not on file  . Years of education: Not on file  . Highest education level: Not on file  Occupational History  . Not on file  Social Needs  . Financial resource strain: Not on file  . Food insecurity:    Worry: Not on file    Inability: Not on file  . Transportation needs:    Medical: Not on file    Non-medical: Not on file  Tobacco Use  . Smoking status: Light Tobacco Smoker  . Smokeless tobacco: Never Used  Substance and Sexual Activity  . Alcohol use: No    Alcohol/week: 0.0 oz  . Drug use: No  . Sexual activity: Yes    Birth control/protection: None  Lifestyle  . Physical activity:    Days per week: Not on file    Minutes per session: Not on file  . Stress: Not on file  Relationships  . Social connections:    Talks on phone: Not on file    Gets together:  Not on file    Attends religious service: Not on file    Active member of club or organization: Not on file    Attends meetings of clubs or organizations: Not on file    Relationship status: Not on file  . Intimate partner violence:    Fear of current or ex partner: Not on file    Emotionally abused: Not on file    Physically abused: Not on file    Forced sexual activity: Not on file  Other Topics Concern  . Not on file  Social History Narrative  . Not on file    Allergies:  No Known Allergies  Medications: Prior to Admission  medications   Medication Sig Start Date End Date Taking? Authorizing Provider  Cyanocobalamin (VITAMIN B-12 IJ) Inject 1 Dose as directed 2 (two) times a week. PT IS HAVING THIS DONE THRU PHYSICIAN WEIGHT LOSS IN COMBINATION WITH PHENTERMINE AND HCG SHOTS 2X WEEKLY   Yes [provider]  loratadine (CLARITIN) 10 MG tablet Take 10 mg by mouth daily.   Yes [provider]  TOVIAZ 4 MG TB24 tablet TAKE 1 TABLET BY MOUTH EVERY DAY IN THE MORNING 09/15/17  Yes [provider]  valACYclovir (VALTREX) 1000 MG tablet Take by mouth.   Yes [provider]  vitamin E (E-400) 400 UNIT capsule Take by mouth.   Yes [provider]    Physical Exam Vitals: Blood pressure 136/84, pulse 69, height 5\' 6"  (1.676 m), weight 184 lb (83.5 kg).  General: NAD HEENT: normocephalic, anicteric Thyroid: no enlargement, no palpable nodules Pulmonary: No increased work of breathing, CTAB Cardiovascular: RRR, distal pulses 2+ Breast: Breast symmetrical, no tenderness, no palpable nodules or masses, no skin or nipple retraction present, no nipple discharge.  No axillary or supraclavicular lymphadenopathy. Abdomen: NABS, soft, non-tender, non-distended.  Umbilicus without lesions.  No hepatomegaly, splenomegaly or masses palpable. No evidence of hernia  Genitourinary:  External: Normal external female genitalia.  Normal urethral meatus, normal Bartholin's and Skene's glands.    Vagina: Normal vaginal mucosa, no evidence of prolapse.    Cervix:surgically absent  Uterus: surgically absent    Adnexa: ovaries non-enlarged, no adnexal masses  Rectal: deferred  Lymphatic: no evidence of inguinal lymphadenopathy Extremities: no edema, erythema, or tenderness Neurologic: Grossly intact Psychiatric: mood appropriate, affect full  Female chaperone present for pelvic and breast  portions of the physical exam     Assessment: 62 y.o. [id number] routine annual exam  Plan: Problem List  Items Addressed This Visit    None    Visit Diagnoses    Health care maintenance    -  Primary   Visit for pelvic exam       Encounter for screening for cervical cancer        Screening breast examination       Screening for breast cancer       Screening for osteoporosis          1) Mammogram - recommend yearly screening mammogram.  Mammogram Was ordered today  2) STI screening  was offered and declined  3) ASCCP guidelines and rational discussed.  Patient opts for discontinue screening interval since her cervix was removed with her hysterectomy and she has never had an abnormal pap smear.   4) Osteoporosis  - per USPTF routine screening DEXA at age 49 - FRAX 36 year major fracture risk 13%,  10 year hip fracture risk 1.5%  Consider FDA-approved medical therapies in postmenopausal women  and men aged 19 years and older, based on the following: a) A hip or vertebral (clinical or morphometric) fracture b) T-score ? -2.5 at the femoral neck or spine after appropriate evaluation to exclude secondary causes C) Low bone mass (T-score between -1.0 and -2.5 at the femoral neck or spine) and a 10-year probability of a hip fracture ? 3% or a 10-year probability of a major osteoporosis-related fracture ? 20% based on the [place]-adapted WHO algorithm   5) Routine healthcare maintenance including cholesterol, diabetes screening discussed managed by PCP  6) Colonoscopy last performed in 2016 according to [facility]. She does not remember when they asked her to follow up. She can not recall the name of the place were the colonoscopy was performed.  Screening recommended starting at age 71 for average risk individuals, age 77 for individuals deemed at increased risk (including African Americans) and recommended to continue until age 31.  For patient age 33-85 individualized approach is recommended.  Gold standard screening is via colonoscopy, Cologuard screening is an acceptable alternative for patient  unwilling or unable to undergo colonoscopy.  "Colorectal cancer screening for average?risk adults: 2018 guideline update from the American Cancer Society"CA: A Cancer Journal for Clinicians: Aug 21, 2016   7) Patient requests referral to general surgery for management of hemorrhoids.   8) Return in about 1 year (around 10/01/2018) for annual.   Adrian Prows MD [place] 09/30/17 2:13 PM

## 2018-02-04 ENCOUNTER — Telehealth: Payer: Self-pay

## 2018-02-04 ENCOUNTER — Other Ambulatory Visit: Payer: Self-pay | Admitting: Obstetrics and Gynecology

## 2018-02-04 DIAGNOSIS — A6009 Herpesviral infection of other urogenital tract: Secondary | ICD-10-CM

## 2018-02-04 MED ORDER — VALACYCLOVIR HCL 1 G PO TABS: 1000.0000 mg | ORAL_TABLET | Freq: Every day | ORAL | 11 refills | Status: AC

## 2018-02-04 NOTE — Progress Notes (Signed)
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   Adrian Prows MD [place], [facility] 02/04/18 1:35 PM

## 2018-02-04 NOTE — Telephone Encounter (Signed)
Pt saw [facility] for AE 09/2017 & thought she mentioned that she had HSV. She usually gets a rx at her physical. She has checked with her pharmacy & there isn't a rx there. She is requesting a rx for Valtrex. Also, she hasn't been for her mammogram yet & is inquiring how she should go about getting that scheduled. PO#[phone]

## 2018-02-04 NOTE — Telephone Encounter (Signed)
I sent a refill for valtrex suppressive therapy. This is a request 4 months after her visit which is a little unusual. The front desk has the phone number of the imaging centers that she can call to schedule a mammogram. The mammogram is ordered. Please call her and let her know the phone number and that I sent a prescription for her.  Thank you,  Dr. Gilman Schmidt

## 2018-02-04 NOTE — Telephone Encounter (Signed)
LMVM to notify pt that rx has been sent in. Given #for [facility] [phone] to schedule mammogram. Patient advised to contact us back if she wishes to schedule her mammogram at a facility other than [facility].

## 2019-07-16 ENCOUNTER — Ambulatory Visit: Payer: 59 | Admitting: Obstetrics and Gynecology

## 2019-08-06 ENCOUNTER — Ambulatory Visit (INDEPENDENT_AMBULATORY_CARE_PROVIDER_SITE_OTHER): Payer: 59 | Admitting: Obstetrics and Gynecology

## 2019-08-06 ENCOUNTER — Encounter: Payer: Self-pay | Admitting: Obstetrics and Gynecology

## 2019-08-06 ENCOUNTER — Other Ambulatory Visit: Payer: Self-pay

## 2019-08-06 VITALS — BP 100/66 | Ht 66.0 in | Wt 175.0 lb

## 2019-08-06 DIAGNOSIS — Z124 Encounter for screening for malignant neoplasm of cervix: Secondary | ICD-10-CM

## 2019-08-06 DIAGNOSIS — Z1239 Encounter for other screening for malignant neoplasm of breast: Secondary | ICD-10-CM

## 2019-08-06 DIAGNOSIS — Z1231 Encounter for screening mammogram for malignant neoplasm of breast: Secondary | ICD-10-CM

## 2019-08-06 DIAGNOSIS — Z1211 Encounter for screening for malignant neoplasm of colon: Secondary | ICD-10-CM

## 2019-08-06 DIAGNOSIS — Z0000 Encounter for general adult medical examination without abnormal findings: Secondary | ICD-10-CM

## 2019-08-06 NOTE — Progress Notes (Signed)
Gynecology Annual Exam  PCP: Maeola Sarah, MD  Chief Complaint:  Chief Complaint  Patient presents with  . Gynecologic Exam    History of Present Illness:Patient is a 64 y.o. [id number] presents for annual exam. The patient has no complaints today.   LMP: No LMP recorded. Patient has had a hysterectomy. Denies vaginal bleeding  The patient is sexually active. She denies dyspareunia.  The patient does not perform self breast exams.  There is no notable family history of breast or ovarian cancer in her family.  The patient wears seatbelts: yes.   The patient has regular exercise: not asked.    The patient denies current symptoms of depression.   Reports using marijuana at night to help with falling asleep.  Review of Systems: Review of Systems  Constitutional: Negative for chills, fever, malaise/fatigue and weight loss.  HENT: Positive for congestion. Negative for hearing loss and sinus pain.   Eyes: Negative for blurred vision and double vision.  Respiratory: Negative for cough, sputum production, shortness of breath and wheezing.   Cardiovascular: Negative for chest pain, palpitations, orthopnea and leg swelling.  Gastrointestinal: Positive for constipation. Negative for abdominal pain, diarrhea, nausea and vomiting.  Genitourinary: Positive for frequency. Negative for dysuria, flank pain, hematuria and urgency.  Musculoskeletal: Negative for back pain, falls and joint pain.  Skin: Negative for itching and rash.  Neurological: Negative for dizziness and headaches.  Psychiatric/Behavioral: Negative for depression, substance abuse and suicidal ideas. The patient is not nervous/anxious.     Past Medical History:  Patient Active Problem List   Diagnosis Date Noted  . Papilloma of breast 08/28/2015    Past Surgical History:  Past Surgical History:  Procedure Laterality Date  . ABDOMINAL HYSTERECTOMY     total  . BREAST DUCTAL SYSTEM EXCISION Left 09/07/2015   Procedure:  EXCISION PAPILLOMA ;  Surgeon: Robert Bellow, MD;  Location: [facility] ORS;  Service: General;  Laterality: Left;  . COLONOSCOPY  2015  . TONSILLECTOMY      Gynecologic History:  No LMP recorded. Patient has had a hysterectomy. Last Pap: Results were: normal Last mammogram: 2017 Results were: BI-RAD I  Obstetric [id number]  Family History:  Family History  Problem Relation Age of Onset  . Colon cancer Father 46  . Lung cancer Mother 2  . Lung cancer Sister     Social History:  Social History   Socioeconomic History  . Marital status: Married    Spouse name: Not on file  . Number of children: Not on file  . Years of education: Not on file  . Highest education level: Not on file  Occupational History  . Not on file  Tobacco Use  . Smoking status: Light Tobacco Smoker  . Smokeless tobacco: Never Used  Substance and Sexual Activity  . Alcohol use: No    Alcohol/week: 0.0 standard drinks  . Drug use: No  . Sexual activity: Yes    Birth control/protection: None  Other Topics Concern  . Not on file  Social History Narrative  . Not on file   Social Determinants of Health   Financial Resource Strain:   . Difficulty of Paying Living Expenses:   Food Insecurity:   . Worried About Charity fundraiser in the Last Year:   . Arboriculturist in the Last Year:   Transportation Needs:   . Film/video editor (Medical):   Marland Kitchen Lack of Transportation (Non-Medical):   Physical Activity:   .  Days of Exercise per Week:   . Minutes of Exercise per Session:   Stress:   . Feeling of Stress :   Social Connections:   . Frequency of Communication with Friends and Family:   . Frequency of Social Gatherings with Friends and Family:   . Attends Religious Services:   . Active Member of Clubs or Organizations:   . Attends Archivist Meetings:   Marland Kitchen Marital Status:   Intimate Partner Violence:   . Fear of Current or Ex-Partner:   . Emotionally Abused:   Marland Kitchen Physically  Abused:   . Sexually Abused:     Allergies:  No Known Allergies  Medications: Prior to Admission medications   Medication Sig Start Date End Date Taking? Authorizing Provider  cetirizine (ZYRTEC) 10 MG tablet Take by mouth.   Yes [provider]  fluticasone (FLONASE) 50 MCG/ACT nasal spray Flonase 50 mcg/actuation nasal spray,suspension  Spray 1 spray every day by intranasal route.   Yes [provider]  TOVIAZ 4 MG TB24 tablet TAKE 1 TABLET BY MOUTH EVERY DAY IN THE MORNING 09/15/17  Yes [provider]  valACYclovir (VALTREX) 1000 MG tablet as needed 03/03/18  Yes [provider]  vitamin E (E-400) 400 UNIT capsule Take by mouth.   Yes [provider]    Physical Exam Vitals: Blood pressure 100/66, height 5\' 6"  (1.676 m), weight 175 lb (79.4 kg).  General: NAD HEENT: normocephalic, anicteric Thyroid: no enlargement, no palpable nodules Pulmonary: No increased work of breathing, CTAB Cardiovascular: RRR, distal pulses 2+ Breast: Breast symmetrical, no tenderness, no palpable nodules or masses, no skin or nipple retraction present, no nipple discharge.  No axillary or supraclavicular lymphadenopathy. Abdomen: NABS, soft, non-tender, non-distended.  Umbilicus without lesions.  No hepatomegaly, splenomegaly or masses palpable. No evidence of hernia  Genitourinary:  External: Normal external female genitalia.  Normal urethral meatus, normal Bartholin's and Skene's glands.    Vagina: Normal vaginal mucosa, no evidence of prolapse.    Cervix: Grossly normal in appearance, no bleeding  Uterus: Non-enlarged, mobile, normal contour.  No CMT  Adnexa: ovaries non-enlarged, no adnexal masses  Rectal: deferred  Lymphatic: no evidence of inguinal lymphadenopathy Extremities: no edema, erythema, or tenderness Neurologic: Grossly intact Psychiatric: mood appropriate, affect full  Female chaperone present for pelvic and breast  portions of the  physical exam     Assessment: 64 y.o. [id number] routine annual exam  Plan: Problem List Items Addressed This Visit    None    Visit Diagnoses    Cervical cancer screening    -  Primary   Health maintenance examination       Encounter for screening mammogram for breast cancer       Relevant Orders   MM 3D SCREEN BREAST BILATERAL   Encounter for screening for cervical cancer        Screening breast examination       Colon cancer screening       Relevant Orders   Ambulatory referral to Gastroenterology      1) Mammogram - recommend yearly screening mammogram.  Mammogram Was ordered today  2) STI screening  was not offered and therefore not obtained  3) ASCCP guidelines and rational discussed.  Patient opts for discontinue secondary to prior hysterectomy screening interval  4) Osteoporosis  - per USPTF routine screening DEXA at age 86 - FRAX 30 year major fracture risk 7.6,  10 year hip fracture risk 0.7  Consider FDA-approved medical therapies  in postmenopausal women and men aged 82 years and older, based on the following: a) A hip or vertebral (clinical or morphometric) fracture b) T-score ? -2.5 at the femoral neck or spine after appropriate evaluation to exclude secondary causes C) Low bone mass (T-score between -1.0 and -2.5 at the femoral neck or spine) and a 10-year probability of a hip fracture ? 3% or a 10-year probability of a major osteoporosis-related fracture ? 20% based on the [place]-adapted WHO algorithm  5) Routine healthcare maintenance including cholesterol, diabetes screening discussed managed by PCP  6) Colonoscopy last performed 2016. Patient reports having polyps and a family hx of colon cancer. Referral to repeat colonoscopy placed.   7) Return in about 1 year (around 08/05/2020) for annual.   Adrian Prows MD [place], [facility] 08/06/2019 10:23 AM

## 2019-08-06 NOTE — Patient Instructions (Signed)
[employer] Recommended Dietary Allowances for Calcium and Vitamin D  Age (yr) Calcium Recommended Dietary Allowance (mg/day) Vitamin D Recommended Dietary Allowance (international units/day)  9-18 1,300 600  19-50 1,000 600  51-70 1,200 600  71 and older 1,200 800  Data from [employer]. Dietary reference intakes: calcium, vitamin D. [place], [place]: [employer]; 2011.    Budget-Friendly Healthy Eating There are many ways to save money at the grocery store and continue to eat healthy. You can be successful if you:  Plan meals according to your budget.  Make a grocery list and only purchase food according to your grocery list.  Prepare food yourself. What are tips for following this plan?  Reading food labels  Compare food labels between brand name foods and the store brand. Often the nutritional value is the same, but the store brand is lower cost.  Look for products that do not have added sugar, fat, or salt (sodium). These often cost the same but are healthier for you. Products may be labeled as: ? Sugar-free. ? Nonfat. ? Low-fat. ? Sodium-free. ? Low-sodium.  Look for lean ground beef labeled as at least 92% lean and 8% fat. Shopping  Buy only the items on your grocery list and go only to the areas of the store that have the items on your list.  Use coupons only for foods and brands you normally buy. Avoid buying items you wouldn't normally buy simply because they are on sale.  Check online and in newspapers for weekly deals.  Buy healthy items from the bulk bins when available, such as herbs, spices, flour, pasta, nuts, and dried fruit.  Buy fruits and vegetables that are in season. Prices are usually lower on in-season produce.  Look at the unit price on the price tag. Use it to compare different brands and sizes to find out which item is the best deal.  Choose healthy items that are often low-cost, such as carrots,  potatoes, apples, bananas, and oranges. Dried or canned beans are a low-cost protein source.  Buy in bulk and freeze extra food. Items you can buy in bulk include meats, fish, poultry, frozen fruits, and frozen vegetables.  Avoid buying "ready-to-eat" foods, such as pre-cut fruits and vegetables and pre-made salads.  If possible, shop around to discover where you can find the best prices. Consider other retailers such as dollar stores, larger wholesale stores, local fruit and vegetable stands, and farmers markets.  Do not shop when you are hungry. If you shop while hungry, it may be hard to stick to your list and budget.  Resist impulse buying. Use your grocery list as your official plan for the week.  Buy a variety of vegetables and fruits by purchasing fresh, frozen, and canned items.  Look at the top and bottom shelves for deals. Foods at eye level (eye level of an adult or child) are usually more expensive.  Be efficient with your time when shopping. The more time you spend at the store, the more money you are likely to spend.  To save money when choosing more expensive foods like meats and dairy: ? Choose cheaper cuts of meat, such as bone-in chicken thighs and drumsticks instead of skinless and boneless chicken. When you are ready to prepare the chicken, you can remove the skin yourself to make it healthier. ? Choose lean meats like chicken or turkey instead of beef. ? Choose canned seafood, such as tuna, salmon, or sardines. ? Buy eggs   as a low-cost source of protein. ? Buy dried beans and peas, such as lentils, split peas, or kidney beans instead of meats. Dried beans and peas are a good alternative source of protein. ? Buy the larger tubs of yogurt instead of individual-sized containers.  Choose water instead of sodas and other sweetened beverages.  Avoid buying chips, cookies, and other "junk food." These items are usually expensive and not healthy. Cooking  Make extra food  and freeze the extras in meal-sized containers or in individual portions for fast meals and snacks.  Pre-cook on days when you have extra time to prepare meals in advance. You can keep these meals in the fridge or freezer and reheat for a quick meal.  When you come home from the grocery store, wash, peel, and cut fruits and vegetables so they are ready to use and eat. This will help reduce food waste. Meal planning  Do not eat out or get fast food. Prepare food at home.  Make a grocery list and make sure to bring it with you to the store. If you have a smart phone, you could use your phone to create your shopping list.  Plan meals and snacks according to a grocery list and budget you create.  Use leftovers in your meal plan for the week.  Look for recipes where you can cook once and make enough food for two meals.  Include budget-friendly meals like stews, casseroles, and stir-fry dishes.  Try some meatless meals or try "no cook" meals like salads.  Make sure that half your plate is filled with fruits or vegetables. Choose from fresh, frozen, or canned fruits and vegetables. If eating canned, remember to rinse them before eating. This will remove any excess salt added for packaging. Summary  Eating healthy on a budget is possible if you plan your meals according to your budget, purchase according to your budget and grocery list, and prepare food yourself.  Tips for buying more food on a limited budget include buying generic brands, using coupons only for foods you normally buy, and buying healthy items from the bulk bins when available.  Tips for buying cheaper food to replace expensive food include choosing cheaper, lean cuts of meat, and buying dried beans and peas. This information is not intended to replace advice given to you by your health care provider. Make sure you discuss any questions you have with your health care provider. Document Revised: 03/12/2017 Document Reviewed:  03/12/2017 Elsevier Patient Education  2020 [employer].   Exercising to Stay Healthy To become healthy and stay healthy, it is recommended that you do moderate-intensity and vigorous-intensity exercise. You can tell that you are exercising at a moderate intensity if your heart starts beating faster and you start breathing faster but can still hold a conversation. You can tell that you are exercising at a vigorous intensity if you are breathing much harder and faster and cannot hold a conversation while exercising. Exercising regularly is important. It has many health benefits, such as:  Improving overall fitness, flexibility, and endurance.  Increasing bone density.  Helping with weight control.  Decreasing body fat.  Increasing muscle strength.  Reducing stress and tension.  Improving overall health. How often should I exercise? Choose an activity that you enjoy, and set realistic goals. Your health care provider can help you make an activity plan that works for you. Exercise regularly as told by your health care provider. This may include:  Doing strength training two times a   week, such as: ? Lifting weights. ? Using resistance bands. ? Push-ups. ? Sit-ups. ? Yoga.  Doing a certain intensity of exercise for a given amount of time. Choose from these options: ? A total of 150 minutes of moderate-intensity exercise every week. ? A total of 75 minutes of vigorous-intensity exercise every week. ? A mix of moderate-intensity and vigorous-intensity exercise every week. Children, pregnant women, people who have not exercised regularly, people who are overweight, and older adults may need to talk with a health care provider about what activities are safe to do. If you have a medical condition, be sure to talk with your health care provider before you start a new exercise program. What are some exercise ideas? Moderate-intensity exercise ideas include:  Walking 1 mile (1.6 km) in  about 15 minutes.  Biking.  Hiking.  Golfing.  Dancing.  Water aerobics. Vigorous-intensity exercise ideas include:  Walking 4.5 miles (7.2 km) or more in about 1 hour.  Jogging or running 5 miles (8 km) in about 1 hour.  Biking 10 miles (16.1 km) or more in about 1 hour.  Lap swimming.  Roller-skating or in-line skating.  Cross-country skiing.  Vigorous competitive sports, such as football, basketball, and soccer.  Jumping rope.  Aerobic dancing. What are some everyday activities that can help me to get exercise?  Yard work, such as: ? Pushing a lawn mower. ? Raking and bagging leaves.  Washing your car.  Pushing a stroller.  Shoveling snow.  Gardening.  Washing windows or floors. How can I be more active in my day-to-day activities?  Use stairs instead of an elevator.  Take a walk during your lunch break.  If you drive, park your car farther away from your work or school.  If you take public transportation, get off one stop early and walk the rest of the way.  Stand up or walk around during all of your indoor phone calls.  Get up, stretch, and walk around every 30 minutes throughout the day.  Enjoy exercise with a friend. Support to continue exercising will help you keep a regular routine of activity. What guidelines can I follow while exercising?  Before you start a new exercise program, talk with your health care provider.  Do not exercise so much that you hurt yourself, feel dizzy, or get very short of breath.  Wear comfortable clothes and wear shoes with good support.  Drink plenty of water while you exercise to prevent dehydration or heat stroke.  Work out until your breathing and your heartbeat get faster. Where to find more information  [place] Department of Health and Human Services: www.hhs.gov  Centers for Disease Control and Prevention (CDC): www.cdc.gov Summary  Exercising regularly is important. It will improve your overall  fitness, flexibility, and endurance.  Regular exercise also will improve your overall health. It can help you control your weight, reduce stress, and improve your bone density.  Do not exercise so much that you hurt yourself, feel dizzy, or get very short of breath.  Before you start a new exercise program, talk with your health care provider. This information is not intended to replace advice given to you by your health care provider. Make sure you discuss any questions you have with your health care provider. Document Revised: 02/21/2017 Document Reviewed: 01/30/2017 Elsevier Patient Education  2020 [employer] Inc.   Bone Health Bones protect organs, store calcium, anchor muscles, and support the whole body. Keeping your bones strong is important, especially as you get   older. You can take actions to help keep your bones strong and healthy. Why is keeping my bones healthy important?  Keeping your bones healthy is important because your body constantly replaces bone cells. Cells get old, and new cells take their place. As we age, we lose bone cells because the body may not be able to make enough new cells to replace the old cells. The amount of bone cells and bone tissue you have is referred to as bone mass. The higher your bone mass, the stronger your bones. The aging process leads to an overall loss of bone mass in the body, which can increase the likelihood of:  Joint pain and stiffness.  Broken bones.  A condition in which the bones become weak and brittle (osteoporosis). A large decline in bone mass occurs in older adults. In women, it occurs about the time of menopause. What actions can I take to keep my bones healthy? Good health habits are important for maintaining healthy bones. This includes eating nutritious foods and exercising regularly. To have healthy bones, you need to get enough of the right minerals and vitamins. Most nutrition experts recommend getting these nutrients from  the foods that you eat. In some cases, taking supplements may also be recommended. Doing certain types of exercise is also important for bone health. What are the nutritional recommendations for healthy bones?  Eating a well-balanced diet with plenty of calcium and vitamin D will help to protect your bones. Nutritional recommendations vary from person to person. Ask your health care provider what is healthy for you. Here are some general guidelines. Get enough calcium Calcium is the most important (essential) mineral for bone health. Most people can get enough calcium from their diet, but supplements may be recommended for people who are at risk for osteoporosis. Good sources of calcium include:  Dairy products, such as low-fat or nonfat milk, cheese, and yogurt.  Dark green leafy vegetables, such as bok choy and broccoli.  Calcium-fortified foods, such as orange juice, cereal, bread, soy beverages, and tofu products.  Nuts, such as almonds. Follow these recommended amounts for daily calcium intake:  Children, age 1-3: 700 mg.  Children, age 4-8: 1,000 mg.  Children, age 9-13: 1,300 mg.  Teens, age 14-18: 1,300 mg.  Adults, age 19-50: 1,000 mg.  Adults, age 51-70: ? Men: 1,000 mg. ? Women: 1,200 mg.  Adults, age 71 or older: 1,200 mg.  Pregnant and breastfeeding females: ? Teens: 1,300 mg. ? Adults: 1,000 mg. Get enough vitamin D Vitamin D is the most essential vitamin for bone health. It helps the body absorb calcium. Sunlight stimulates the skin to make vitamin D, so be sure to get enough sunlight. If you live in a cold climate or you do not get outside often, your health care provider may recommend that you take vitamin D supplements. Good sources of vitamin D in your diet include:  Egg yolks.  Saltwater fish.  Milk and cereal fortified with vitamin D. Follow these recommended amounts for daily vitamin D intake:  Children and teens, age 1-18: 600 international  units.  Adults, age 50 or younger: 400-800 international units.  Adults, age 51 or older: 800-1,000 international units. Get other important nutrients Other nutrients that are important for bone health include:  Phosphorus. This mineral is found in meat, poultry, dairy foods, nuts, and legumes. The recommended daily intake for adult men and adult women is 700 mg.  Magnesium. This mineral is found in seeds, nuts, dark green   vegetables, and legumes. The recommended daily intake for adult men is 400-420 mg. For adult women, it is 310-320 mg.  Vitamin K. This vitamin is found in green leafy vegetables. The recommended daily intake is 120 mg for adult men and 90 mg for adult women. What type of physical activity is best for building and maintaining healthy bones? Weight-bearing and strength-building activities are important for building and maintaining healthy bones. Weight-bearing activities cause muscles and bones to work against gravity. Strength-building activities increase the strength of the muscles that support bones. Weight-bearing and muscle-building activities include:  Walking and hiking.  Jogging and running.  Dancing.  Gym exercises.  Lifting weights.  Tennis and racquetball.  Climbing stairs.  Aerobics. Adults should get at least 30 minutes of moderate physical activity on most days. Children should get at least 60 minutes of moderate physical activity on most days. Ask your health care provider what type of exercise is best for you. How can I find out if my bone mass is low? Bone mass can be measured with an X-ray test called a bone mineral density (BMD) test. This test is recommended for all women who are age 65 or older. It may also be recommended for:  Men who are age 70 or older.  People who are at risk for osteoporosis because of: ? Having bones that break easily. ? Having a long-term disease that weakens bones, such as kidney disease or rheumatoid  arthritis. ? Having menopause earlier than normal. ? Taking medicine that weakens bones, such as steroids, thyroid hormones, or hormone treatment for breast cancer or prostate cancer. ? Smoking. ? Drinking three or more alcoholic drinks a day. If you find that you have a low bone mass, you may be able to prevent osteoporosis or further bone loss by changing your diet and lifestyle. Where can I find more information? For more information, check out the following websites:  National Osteoporosis Foundation: www.nof.org/patients  National Institutes of Health: www.bones.nih.gov  International Osteoporosis Foundation: www.iofbonehealth.org Summary  The aging process leads to an overall loss of bone mass in the body, which can increase the likelihood of broken bones and osteoporosis.  Eating a well-balanced diet with plenty of calcium and vitamin D will help to protect your bones.  Weight-bearing and strength-building activities are also important for building and maintaining strong bones.  Bone mass can be measured with an X-ray test called a bone mineral density (BMD) test. This information is not intended to replace advice given to you by your health care provider. Make sure you discuss any questions you have with your health care provider. Document Revised: 04/07/2017 Document Reviewed: 04/07/2017 Elsevier Patient Education  2020 [employer].   

## 2019-08-11 ENCOUNTER — Telehealth: Payer: 59

## 2019-09-15 ENCOUNTER — Other Ambulatory Visit: Payer: Self-pay

## 2019-09-15 ENCOUNTER — Ambulatory Visit
Admission: RE | Admit: 2019-09-15 | Discharge: 2019-09-15 | Disposition: A | Discharge status: Home / Self Care | Payer: 59 | Source: Ambulatory Visit | Attending: Obstetrics and Gynecology | Admitting: Obstetrics and Gynecology

## 2019-09-15 DIAGNOSIS — Z1231 Encounter for screening mammogram for malignant neoplasm of breast: Secondary | ICD-10-CM | POA: Diagnosis present

## 2020-01-31 ENCOUNTER — Other Ambulatory Visit: Payer: Self-pay | Admitting: Obstetrics and Gynecology

## 2020-08-11 ENCOUNTER — Other Ambulatory Visit: Payer: Self-pay

## 2020-08-11 ENCOUNTER — Ambulatory Visit (INDEPENDENT_AMBULATORY_CARE_PROVIDER_SITE_OTHER): Payer: 59 | Admitting: Obstetrics and Gynecology

## 2020-08-11 ENCOUNTER — Encounter: Payer: Self-pay | Admitting: Obstetrics and Gynecology

## 2020-08-11 VITALS — BP 116/70 | Ht 66.0 in | Wt 171.0 lb

## 2020-08-11 DIAGNOSIS — Z0000 Encounter for general adult medical examination without abnormal findings: Secondary | ICD-10-CM

## 2020-08-11 DIAGNOSIS — Z1231 Encounter for screening mammogram for malignant neoplasm of breast: Secondary | ICD-10-CM

## 2020-08-11 DIAGNOSIS — F5102 Adjustment insomnia: Secondary | ICD-10-CM

## 2020-08-11 DIAGNOSIS — N907 Vulvar cyst: Secondary | ICD-10-CM

## 2020-08-11 DIAGNOSIS — Z01419 Encounter for gynecological examination (general) (routine) without abnormal findings: Secondary | ICD-10-CM | POA: Diagnosis not present

## 2020-08-11 MED ORDER — ZOLPIDEM TARTRATE 5 MG PO TABS: 5.0000 mg | ORAL_TABLET | Freq: Every evening | ORAL | 1 refills | Status: DC | PRN
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
## 2020-08-11 NOTE — Patient Instructions (Addendum)
[employer] of [place] for Calcium and Vitamin D  Age (yr) Calcium Recommended Dietary Allowance (mg/day) Vitamin D Recommended Dietary Allowance (international units/day)  9-18 1,300 600  19-50 1,000 600  51-70 1,200 600  71 and older 1,200 800  Data from [employer]. Dietary reference intakes: calcium, vitamin D. [place], [place]: [employer]; 2011.       Exercising to Stay Healthy To become healthy and stay healthy, it is recommended that you do moderate-intensity and vigorous-intensity exercise. You can tell that you are exercising at a moderate intensity if your heart starts beating faster and you start breathing faster but can still hold a conversation. You can tell that you are exercising at a vigorous intensity if you are breathing much harder and faster and cannot hold a conversation while exercising. Exercising regularly is important. It has many health benefits, such as:  Improving overall fitness, flexibility, and endurance.  Increasing bone density.  Helping with weight control.  Decreasing body fat.  Increasing muscle strength.  Reducing stress and tension.  Improving overall health. How often should I exercise? Choose an activity that you enjoy, and set realistic goals. Your health care provider can help you make an activity plan that works for you. Exercise regularly as told by your health care provider. This may include:  Doing strength training two times a week, such as: ? Lifting weights. ? Using resistance bands. ? Push-ups. ? Sit-ups. ? Yoga.  Doing a certain intensity of exercise for a given amount of time. Choose from these options: ? A total of 150 minutes of moderate-intensity exercise every week. ? A total of 75 minutes of vigorous-intensity exercise every week. ? A mix of moderate-intensity and vigorous-intensity exercise every week. Children, pregnant women, people who have not  exercised regularly, people who are overweight, and older adults may need to talk with a health care provider about what activities are safe to do. If you have a medical condition, be sure to talk with your health care provider before you start a new exercise program. What are some exercise ideas? Moderate-intensity exercise ideas include:  Walking 1 mile (1.6 km) in about 15 minutes.  Biking.  Hiking.  Golfing.  Dancing.  Water aerobics. Vigorous-intensity exercise ideas include:  Walking 4.5 miles (7.2 km) or more in about 1 hour.  Jogging or running 5 miles (8 km) in about 1 hour.  Biking 10 miles (16.1 km) or more in about 1 hour.  Lap swimming.  Roller-skating or in-line skating.  Cross-country skiing.  Vigorous competitive sports, such as football, basketball, and soccer.  Jumping rope.  Aerobic dancing.   What are some everyday activities that can help me to get exercise?  [place] work, such as: ? Pushing a Conservation officer, nature. ? Raking and bagging leaves.  Washing your car.  Pushing a stroller.  Shoveling snow.  Gardening.  Washing windows or floors. How can I be more active in my day-to-day activities?  Use stairs instead of an elevator.  Take a walk during your lunch break.  If you drive, park your car farther away from your work or school.  If you take public transportation, get off one stop early and walk the rest of the way.  Stand up or walk around during all of your indoor phone calls.  Get up, stretch, and walk around every 30 minutes throughout the day.  Enjoy exercise with a friend. Support to continue exercising will help you keep a regular routine of  can I follow while exercising?  Before you start a new exercise program, talk with your health care provider.  Do not exercise so much that you hurt yourself, feel dizzy, or get very short of breath.  Wear comfortable clothes and wear shoes with good support.  Drink plenty of  water while you exercise to prevent dehydration or heat stroke.  Work out until your breathing and your heartbeat get faster. Where to find more information  U[place]S. Department of Health and Human Services: www.hhs.gov  Centers for Disease Control and Prevention (CDC): www.cdc.gov Summary  Exercising regularly is important. It will improve your overall fitness, flexibility, and endurance.  Regular exercise also will improve your overall health. It can help you control your weight, reduce stress, and improve your bone density.  Do not exercise so much that you hurt yourself, feel dizzy, or get very short of breath.  Before you start a new exercise program, talk with your health care provider. This information is not intended to replace advice given to you by your health care provider. Make sure you discuss any questions you have with your health care provider. Document Revised: 02/21/2017 Document Reviewed: 01/30/2017 Elsevier Patient Education  2021 Elsevier Inc.   Budget-Friendly Healthy Eating There are many ways to save money at the grocery store and continue to eat healthy. You can be successful if you:  Plan meals according to your budget.  Make a grocery list and only purchase food according to your grocery list.  Prepare food yourself at home. What are tips for following this plan? Reading food labels  Compare food labels between brand name foods and the store brand. Often the nutritional value is the same, but the store brand is lower cost.  Look for products that do not have added sugar, fat, or salt (sodium). These often cost the same but are healthier for you. Products may be labeled as: ? Sugar-free. ? Nonfat. ? Low-fat. ? Sodium-free. ? Low-sodium.  Look for lean ground beef labeled as at least 92% lean and 8% fat. Shopping  Buy only the items on your grocery list and go only to the areas of the store that have the items on your list.  Use coupons only for  foods and brands you normally buy. Avoid buying items you wouldn't normally buy simply because they are on sale.  Check online and in newspapers for weekly deals.  Buy healthy items from the bulk bins when available, such as herbs, spices, flour, pasta, nuts, and dried fruit.  Buy fruits and vegetables that are in season. Prices are usually lower on in-season produce.  Look at the unit price on the price tag. Use it to compare different brands and sizes to find out which item is the best deal.  Choose healthy items that are often low-cost, such as carrots, potatoes, apples, bananas, and oranges. Dried or canned beans are a low-cost protein source.  Buy in bulk and freeze extra food. Items you can buy in bulk include meats, fish, poultry, frozen fruits, and frozen vegetables.  Avoid buying "ready-to-eat" foods, such as pre-cut fruits and vegetables and pre-made salads.  If possible, shop around to discover where you can find the best prices. Consider other retailers such as dollar stores, larger wholesale stores, local fruit and vegetable stands, and farmers markets.  Do not shop when you are hungry. If you shop while hungry, it may be hard to stick to your list and budget.  Resist impulse buying. Use your grocery   list as your official plan for the week.  Buy a variety of vegetables and fruits by purchasing fresh, frozen, and canned items.  Look at the top and bottom shelves for deals. Foods at eye level (eye level of an adult or child) are usually more expensive.  Be efficient with your time when shopping. The more time you spend at the store, the more money you are likely to spend.  To save money when choosing more expensive foods like meats and dairy: ? Choose cheaper cuts of meat, such as bone-in chicken thighs and drumsticks instead of skinless and boneless chicken. When you are ready to prepare the chicken, you can remove the skin yourself to make it healthier. ? Choose lean meats  like chicken or turkey instead of beef. ? Choose canned seafood, such as tuna, salmon, or sardines. ? Buy eggs as a low-cost source of protein. ? Buy dried beans and peas, such as lentils, split peas, or kidney beans instead of meats. Dried beans and peas are a good alternative source of protein. ? Buy the larger tubs of yogurt instead of individual-sized containers.  Choose water instead of sodas and other sweetened beverages.  Avoid buying chips, cookies, and other "junk food." These items are usually expensive and not healthy.   Cooking  Make extra food and freeze the extras in meal-sized containers or in individual portions for fast meals and snacks.  Pre-cook on days when you have extra time to prepare meals in advance. You can keep these meals in the fridge or freezer and reheat for a quick meal.  When you come home from the grocery store, wash, peel, and cut fruits and vegetables so they are ready to use and eat. This will help reduce food waste. Meal planning  Do not eat out or get fast food. Prepare food at home.  Make a grocery list and make sure to bring it with you to the store. If you have a smart phone, you could use your phone to create your shopping list.  Plan meals and snacks according to a grocery list and budget you create.  Use leftovers in your meal plan for the week.  Look for recipes where you can cook once and make enough food for two meals.  Prepare budget-friendly types of meals like stews, casseroles, and stir-fry dishes.  Try some meatless meals or try "no cook" meals like salads.  Make sure that half your plate is filled with fruits or vegetables. Choose from fresh, frozen, or canned fruits and vegetables. If eating canned, remember to rinse them before eating. This will remove any excess salt added for packaging. Summary  Eating healthy on a budget is possible if you plan your meals according to your budget, purchase according to your budget and  grocery list, and prepare food yourself.  Tips for buying more food on a limited budget include buying generic brands, using coupons only for foods you normally buy, and buying healthy items from the bulk bins when available.  Tips for buying cheaper food to replace expensive food include choosing cheaper, lean cuts of meat, and buying dried beans and peas. This information is not intended to replace advice given to you by your health care provider. Make sure you discuss any questions you have with your health care provider. Document Revised: 12/23/2019 Document Reviewed: 12/23/2019 Elsevier Patient Education  2021 Elsevier Inc.   Bone Health Bones protect organs, store calcium, anchor muscles, and support the whole body. Keeping your bones   strong is important, especially as you get older. You can take actions to help keep your bones strong and healthy. Why is keeping my bones healthy important? Keeping your bones healthy is important because your body constantly replaces bone cells. Cells get old, and new cells take their place. As we age, we lose bone cells because the body may not be able to make enough new cells to replace the old cells. The amount of bone cells and bone tissue you have is referred to as bone mass. The higher your bone mass, the stronger your bones. The aging process leads to an overall loss of bone mass in the body, which can increase the likelihood of:  Joint pain and stiffness.  Broken bones.  A condition in which the bones become weak and brittle (osteoporosis). A large decline in bone mass occurs in older adults. In women, it occurs about the time of menopause.   What actions can I take to keep my bones healthy? Good health habits are important for maintaining healthy bones. This includes eating nutritious foods and exercising regularly. To have healthy bones, you need to get enough of the right minerals and vitamins. Most nutrition experts recommend getting these  nutrients from the foods that you eat. In some cases, taking supplements may also be recommended. Doing certain types of exercise is also important for bone health. What are the nutritional recommendations for healthy bones? Eating a well-balanced diet with plenty of calcium and vitamin D will help to protect your bones. Nutritional recommendations vary from person to person. Ask your health care provider what is healthy for you. Here are some general guidelines. Get enough calcium Calcium is the most important (essential) mineral for bone health. Most people can get enough calcium from their diet, but supplements may be recommended for people who are at risk for osteoporosis. Good sources of calcium include:  Dairy products, such as low-fat or nonfat milk, cheese, and yogurt.  Dark green leafy vegetables, such as bok choy and broccoli.  Calcium-fortified foods, such as orange juice, cereal, bread, soy beverages, and tofu products.  Nuts, such as almonds. Follow these recommended amounts for daily calcium intake:  Children, age 1-3: 700 mg.  Children, age 4-8: 1,000 mg.  Children, age 9-13: 1,300 mg.  Teens, age 14-18: 1,300 mg.  Adults, age 19-50: 1,000 mg.  Adults, age 51-70: ? Men: 1,000 mg. ? Women: 1,200 mg.  Adults, age 71 or older: 1,200 mg.  Pregnant and breastfeeding females: ? Teens: 1,300 mg. ? Adults: 1,000 mg. Get enough vitamin D Vitamin D is the most essential vitamin for bone health. It helps the body absorb calcium. Sunlight stimulates the skin to make vitamin D, so be sure to get enough sunlight. If you live in a cold climate or you do not get outside often, your health care provider may recommend that you take vitamin D supplements. Good sources of vitamin D in your diet include:  Egg yolks.  Saltwater fish.  Milk and cereal fortified with vitamin D. Follow these recommended amounts for daily vitamin D intake:  Children and teens, age 1-18: 600  international units.  Adults, age 50 or younger: 400-800 international units.  Adults, age 51 or older: 800-1,000 international units. Get other important nutrients Other nutrients that are important for bone health include:  Phosphorus. This mineral is found in meat, poultry, dairy foods, nuts, and legumes. The recommended daily intake for adult men and adult women is 700 mg.  Magnesium. This mineral   is found in seeds, nuts, dark green vegetables, and legumes. The recommended daily intake for adult men is 400-420 mg. For adult women, it is 310-320 mg.  Vitamin K. This vitamin is found in green leafy vegetables. The recommended daily intake is 120 mg for adult men and 90 mg for adult women.   What type of physical activity is best for building and maintaining healthy bones? Weight-bearing and strength-building activities are important for building and maintaining healthy bones. Weight-bearing activities cause muscles and bones to work against gravity. Strength-building activities increase the strength of the muscles that support bones. Weight-bearing and muscle-building activities include:  Walking and hiking.  Jogging and running.  Dancing.  Gym exercises.  Lifting weights.  Tennis and racquetball.  Climbing stairs.  Aerobics. Adults should get at least 30 minutes of moderate physical activity on most days. Children should get at least 60 minutes of moderate physical activity on most days. Ask your health care provider what type of exercise is best for you.   How can I find out if my bone mass is low? Bone mass can be measured with an X-ray test called a bone mineral density (BMD) test. This test is recommended for all women who are age 65 or older. It may also be recommended for:  Men who are age 70 or older.  People who are at risk for osteoporosis because of: ? Having bones that break easily. ? Having a long-term disease that weakens bones, such as kidney disease or  rheumatoid arthritis. ? Having menopause earlier than normal. ? Taking medicine that weakens bones, such as steroids, thyroid hormones, or hormone treatment for breast cancer or prostate cancer. ? Smoking. ? Drinking three or more alcoholic drinks a day. If you find that you have a low bone mass, you may be able to prevent osteoporosis or further bone loss by changing your diet and lifestyle. Where can I find more information? For more information, check out the following websites:  National Osteoporosis Foundation: www.nof.org/patients  National Institutes of Health: www.bones.nih.gov  International Osteoporosis Foundation: www.iofbonehealth.org Summary  The aging process leads to an overall loss of bone mass in the body, which can increase the likelihood of broken bones and osteoporosis.  Eating a well-balanced diet with plenty of calcium and vitamin D will help to protect your bones.  Weight-bearing and strength-building activities are also important for building and maintaining strong bones.  Bone mass can be measured with an X-ray test called a bone mineral density (BMD) test. This information is not intended to replace advice given to you by your health care provider. Make sure you discuss any questions you have with your health care provider. Document Revised: 04/07/2017 Document Reviewed: 04/07/2017 Elsevier Patient Education  2021 Elsevier Inc.   Managing Anxiety, Adult After being diagnosed with an anxiety disorder, you may be relieved to know why you have felt or behaved a certain way. You may also feel overwhelmed about the treatment ahead and what it will mean for your life. With care and support, you can manage this condition and recover from it. How to manage lifestyle changes Managing stress and anxiety Stress is your body's reaction to life changes and events, both good and bad. Most stress will last just a few hours, but stress can be ongoing and can lead to more  than just stress. Although stress can play a major role in anxiety, it is not the same as anxiety. Stress is usually caused by something external, such as a   deadline, test, or competition. Stress normally passes after the triggering event has ended.  Anxiety is caused by something internal, such as imagining a terrible outcome or worrying that something will go wrong that will devastate you. Anxiety often does not go away even after the triggering event is over, and it can become long-term (chronic) worry. It is important to understand the differences between stress and anxiety and to manage your stress effectively so that it does not lead to an anxious response. Talk with your health care provider or a counselor to learn more about reducing anxiety and stress. He or she may suggest tension reduction techniques, such as:  Music therapy. This can include creating or listening to music that you enjoy and that inspires you.  Mindfulness-based meditation. This involves being aware of your normal breaths while not trying to control your breathing. It can be done while sitting or walking.  Centering prayer. This involves focusing on a word, phrase, or sacred image that means something to you and brings you peace.  Deep breathing. To do this, expand your stomach and inhale slowly through your nose. Hold your breath for 3-5 seconds. Then exhale slowly, letting your stomach muscles relax.  Self-talk. This involves identifying thought patterns that lead to anxiety reactions and changing those patterns.  Muscle relaxation. This involves tensing muscles and then relaxing them. Choose a tension reduction technique that suits your lifestyle and personality. These techniques take time and practice. Set aside 5-15 minutes a day to do them. Therapists can offer counseling and training in these techniques. The training to help with anxiety may be covered by some insurance plans. Other things you can do to manage stress  and anxiety include:  Keeping a stress/anxiety diary. This can help you learn what triggers your reaction and then learn ways to manage your response.  Thinking about how you react to certain situations. You may not be able to control everything, but you can control your response.  Making time for activities that help you relax and not feeling guilty about spending your time in this way.  Visual imagery and yoga can help you stay calm and relax.   Medicines Medicines can help ease symptoms. Medicines for anxiety include:  Anti-anxiety drugs.  Antidepressants. Medicines are often used as a primary treatment for anxiety disorder. Medicines will be prescribed by a health care provider. When used together, medicines, psychotherapy, and tension reduction techniques may be the most effective treatment. Relationships Relationships can play a big part in helping you recover. Try to spend more time connecting with trusted friends and family members. Consider going to couples counseling, taking family education classes, or going to family therapy. Therapy can help you and others better understand your condition. How to recognize changes in your anxiety Everyone responds differently to treatment for anxiety. Recovery from anxiety happens when symptoms decrease and stop interfering with your daily activities at home or work. This may mean that you will start to:  Have better concentration and focus. Worry will interfere less in your daily thinking.  Sleep better.  Be less irritable.  Have more energy.  Have improved memory. It is important to recognize when your condition is getting worse. Contact your health care provider if your symptoms interfere with home or work and you feel like your condition is not improving. Follow these instructions at home: Activity  Exercise. Most adults should do the following: ? Exercise for at least 150 minutes each week. The exercise should increase your heart    rate and make you sweat (moderate-intensity exercise). ? Strengthening exercises at least twice a week.  Get the right amount and quality of sleep. Most adults need 7-9 hours of sleep each night. Lifestyle  Eat a healthy diet that includes plenty of vegetables, fruits, whole grains, low-fat dairy products, and lean protein. Do not eat a lot of foods that are high in solid fats, added sugars, or salt.  Make choices that simplify your life.  Do not use any products that contain nicotine or tobacco, such as cigarettes, e-cigarettes, and chewing tobacco. If you need help quitting, ask your health care provider.  Avoid caffeine, alcohol, and certain over-the-counter cold medicines. These may make you feel worse. Ask your pharmacist which medicines to avoid.   General instructions  Take over-the-counter and prescription medicines only as told by your health care provider.  Keep all follow-up visits as told by your health care provider. This is important. Where to find support You can get help and support from these sources:  Self-help groups.  Online and community organizations.  A trusted spiritual leader.  Couples counseling.  Family education classes.  Family therapy. Where to find more information You may find that joining a support group helps you deal with your anxiety. The following sources can help you locate counselors or support groups near you:  Mental Health America: www.mentalhealthamerica.net  Anxiety and Depression Association of America (ADAA): www.adaa.org  National Alliance on Mental Illness (NAMI): www.nami.org Contact a health care provider if you:  Have a hard time staying focused or finishing daily tasks.  Spend many hours a day feeling worried about everyday life.  Become exhausted by worry.  Start to have headaches, feel tense, or have nausea.  Urinate more than normal.  Have diarrhea. Get help right away if you have:  A racing heart and  shortness of breath.  Thoughts of hurting yourself or others. If you ever feel like you may hurt yourself or others, or have thoughts about taking your own life, get help right away. You can go to your nearest emergency department or call:  Your local emergency services (911 in the [place]  A suicide crisis helpline, such as the National Suicide Prevention Lifeline at 1-800-273-8255. This is open 24 hours a day. Summary  Taking steps to learn and use tension reduction techniques can help calm you and help prevent triggering an anxiety reaction.  When used together, medicines, psychotherapy, and tension reduction techniques may be the most effective treatment.  Family, friends, and partners can play a big part in helping you recover from an anxiety disorder. This information is not intended to replace advice given to you by your health care provider. Make sure you discuss any questions you have with your health care provider. Document Revised: 08/11/2018 Document Reviewed: 08/11/2018 Elsevier Patient Education  2021 Elsevier Inc.   

## 2020-09-19 ENCOUNTER — Other Ambulatory Visit: Payer: Self-pay

## 2020-09-19 ENCOUNTER — Ambulatory Visit
Admission: RE | Admit: 2020-09-19 | Discharge: 2020-09-19 | Disposition: A | Discharge status: Home / Self Care | Payer: 59 | Source: Ambulatory Visit | Attending: Obstetrics and Gynecology | Admitting: Obstetrics and Gynecology

## 2020-09-19 DIAGNOSIS — Z1231 Encounter for screening mammogram for malignant neoplasm of breast: Secondary | ICD-10-CM | POA: Diagnosis not present

## 2022-01-27 IMAGING — MG MM DIGITAL SCREENING BILAT W/ TOMO AND CAD
8 series · 8 of 24 positions shown · non-contrast
Comparison: Previous exam(s).

CLINICAL DATA: Screening.

EXAM:
DIGITAL SCREENING BILATERAL MAMMOGRAM WITH TOMOSYNTHESIS AND CAD
TECHNIQUE: Bilateral screening digital craniocaudal and mediolateral oblique
mammograms were obtained. Bilateral screening digital breast
tomosynthesis was performed. The images were evaluated with
computer-aided detection.

[R CC synth-2D]
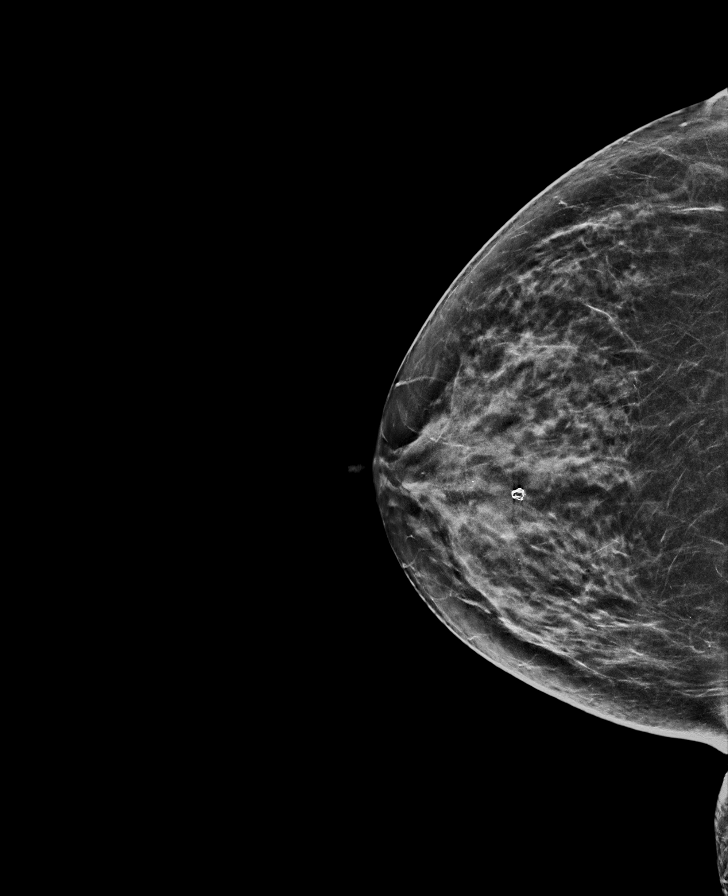

[L MLO synth-2D]
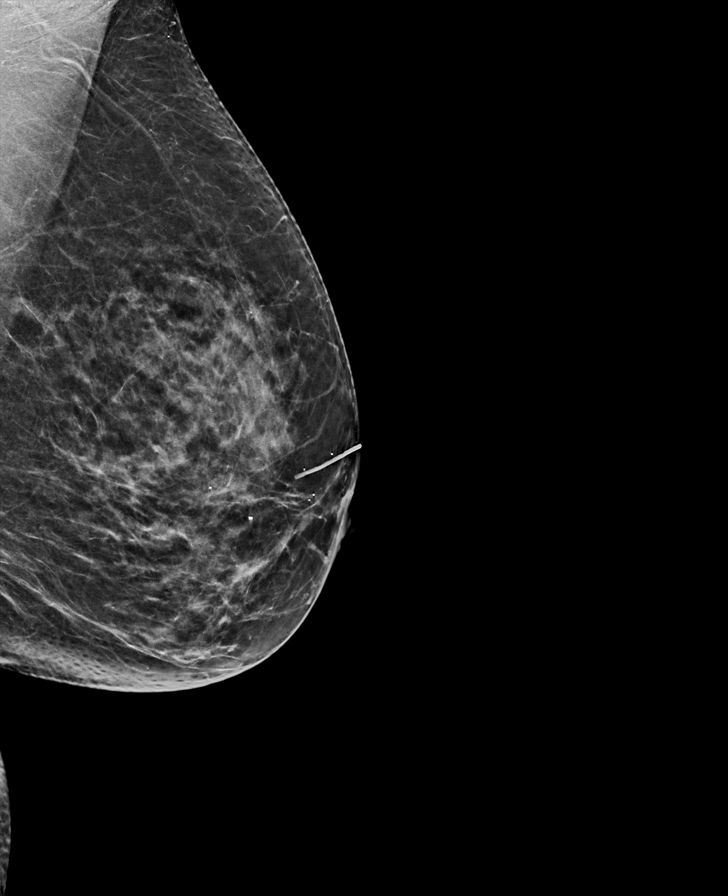

[L CC synth-2D]
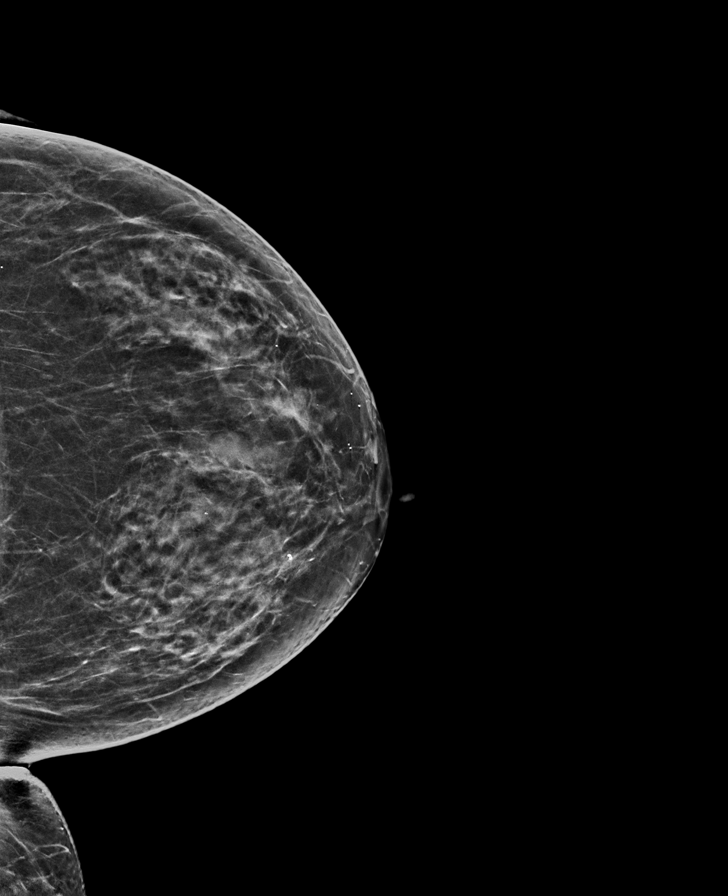

[R MLO synth-2D]
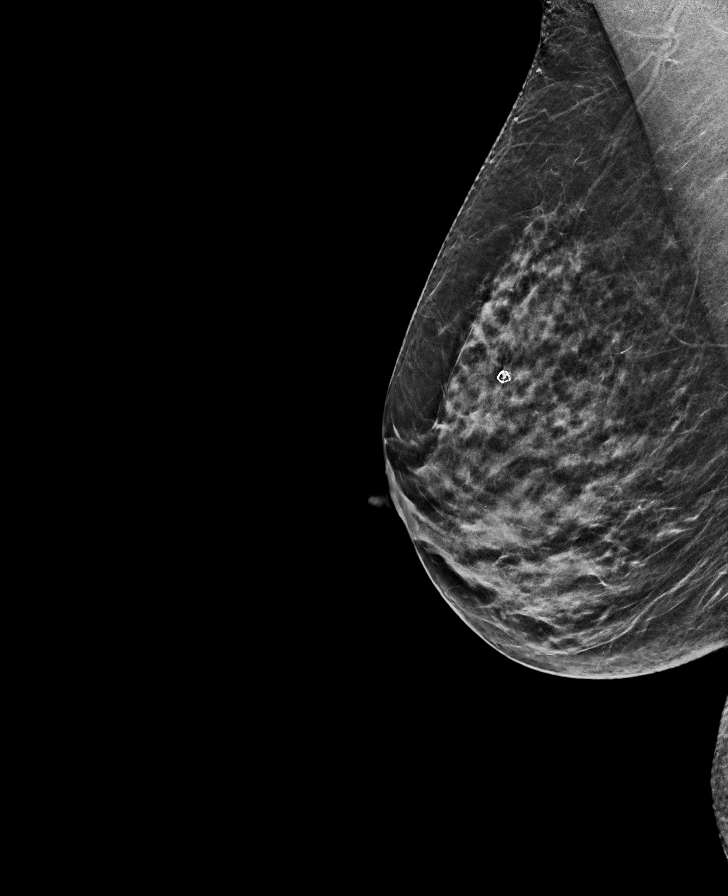

[R MLO tomo · tomo slice 29/58.0]
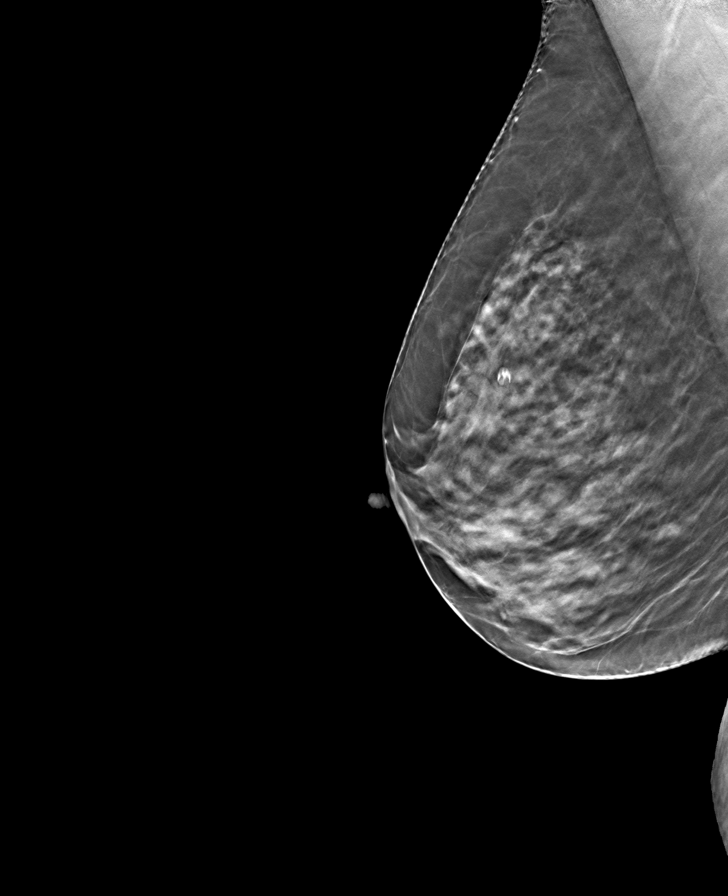

[R CC tomo · tomo slice 33/64.0]
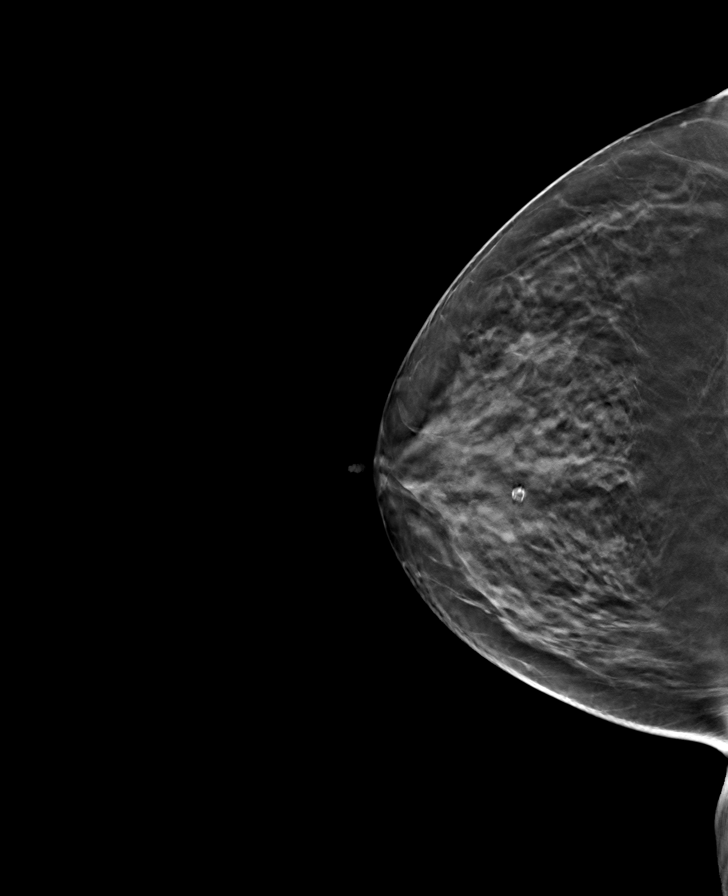

[L MLO tomo · tomo slice 30/59.0]
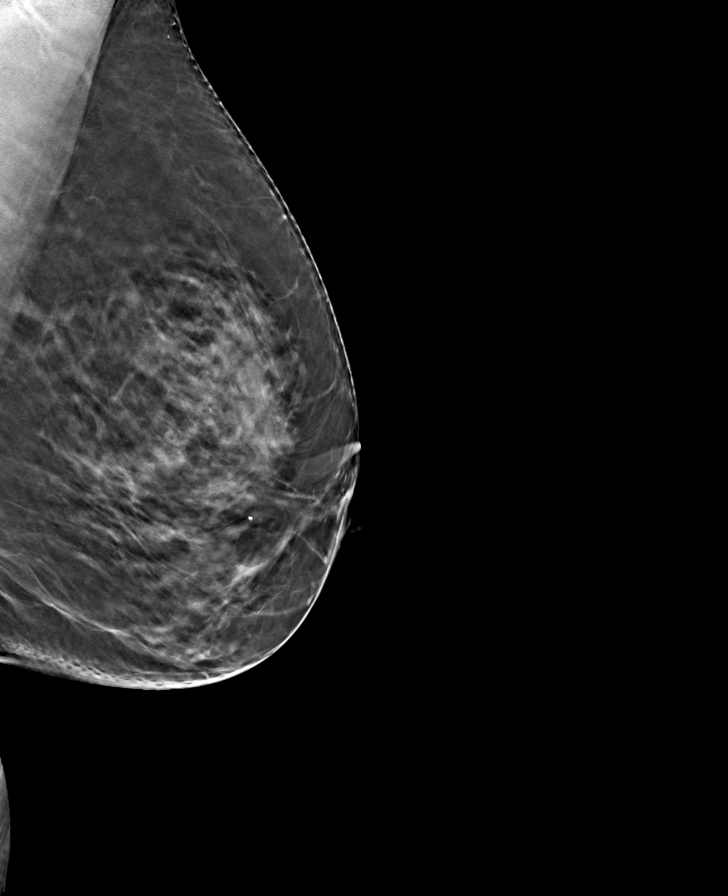

[L CC tomo · tomo slice 31/62.0]
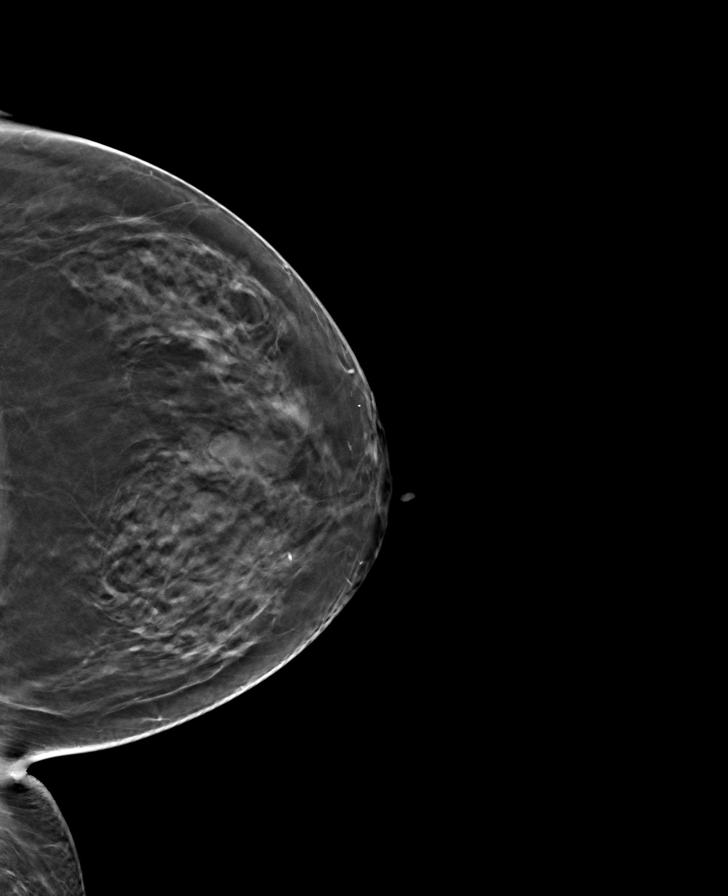

[8 of 24 positions shown; findings below may reference images not displayed]

ACR Breast Density Category c: The breast tissue is heterogeneously
dense, which may obscure small masses.
FINDINGS: There are no findings suspicious for malignancy.
IMPRESSION: No mammographic evidence of malignancy. A result letter of this
screening mammogram will be mailed directly to the patient.

RECOMMENDATION:
Screening mammogram in one year. (Code:Q3-W-BC3)

BI-RADS CATEGORY  1: Negative.

## 2022-08-12 NOTE — Progress Notes (Unsigned)
PCP: Estell Harpin, MD   No chief complaint on file.   HPI:      [name] is a 67 y.o. [id number] whose LMP was No LMP recorded. Patient has had a hysterectomy., presents today for her annual examination.  Her menses are absent due to hyst, no PMB.  She {does:18564} have vasomotor sx.   Sex activity: {sex active: 315163}. She {does:18564} have vaginal dryness.  Last Pap: {[id number]}  Results were: {norm/abn:16707::"no abnormalities"} /neg HPV DNA.  Hx of STDs: {STD hx:14358}  Last mammogram: 09/19/20 Results were: normal--routine follow-up in 12 months There is no FH of breast cancer. There is no FH of ovarian cancer. The patient {does:18564} do self-breast exams.  Colonoscopy: 2015 Repeat due after 10*** years.   Tobacco use: {tob:20664} Alcohol use: {Alcohol:11675} No drug use Exercise: {exercise:31265}  She {does:18564} get adequate calcium and Vitamin D in her diet.  Labs with PCP.   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    Past Surgical History:  Procedure Laterality Date   ABDOMINAL HYSTERECTOMY     total   BREAST BIOPSY Left    neg   BREAST DUCTAL SYSTEM EXCISION Left 09/07/2015   Procedure: EXCISION PAPILLOMA ;  Surgeon: Earline Mayotte, MD;  Location: [facility] ORS;  Service: General;  Laterality: Left;   COLONOSCOPY  2015   TONSILLECTOMY      Family History  Problem Relation Age of Onset   Colon cancer Father 34   Lung cancer Mother 64   Lung cancer Sister    Breast cancer Neg Hx     Social History   Socioeconomic History   Marital status: Married    Spouse name: Not on file   Number of children: Not on file   Years of education: Not on file   Highest education level: Not on file  Occupational History   Not on file  Tobacco Use   Smoking status: Light Smoker   Smokeless tobacco: Never  Vaping Use   Vaping Use: Never used  Substance and Sexual Activity   Alcohol use: No     Alcohol/week: 0.0 standard drinks of alcohol   Drug use: No   Sexual activity: Yes    Birth control/protection: None  Other Topics Concern   Not on file  Social History Narrative   Not on file   Social Determinants of Health   Financial Resource Strain: Not on file  Food Insecurity: Not on file  Transportation Needs: Not on file  Physical Activity: Not on file  Stress: Not on file  Social Connections: Not on file  Intimate Partner Violence: Not on file     Current Outpatient Medications:    cetirizine (ZYRTEC) 10 MG tablet, Take by mouth., Disp: , Rfl:    fluticasone (FLONASE) 50 MCG/ACT nasal spray, Flonase 50 mcg/actuation nasal spray,suspension  Spray 1 spray every day by intranasal route., Disp: , Rfl:    TOVIAZ 4 MG TB24 tablet, TAKE 1 TABLET BY MOUTH EVERY DAY IN THE MORNING, Disp: , Rfl: 3   valACYclovir (VALTREX) 1000 MG tablet, TAKE 1 TABLET BY MOUTH EVERY DAY, Disp: 30 tablet, Rfl: 11   vitamin E 180 MG (400 UNITS) capsule, Take by mouth., Disp: , Rfl:    zolpidem (AMBIEN) 5 MG tablet, Take 1 tablet (5 mg total) by mouth at bedtime as needed for sleep., Disp: 30 tablet, Rfl: 1     ROS:  Review of  Systems BREAST: No symptoms    Objective: There were no vitals taken for this visit.   OBGyn Exam  Results: No results found for this or any previous visit (from the past 24 hour(s)).  Assessment/Plan:  No diagnosis found.   No orders of the defined types were placed in this encounter.           GYN counsel {counseling: 16159}    F/U  No follow-ups on file.  Loriene Taunton B. Aarian Cleaver, PA-C 08/12/2022 4:50 PM

## 2022-08-13 ENCOUNTER — Encounter: Payer: Self-pay | Admitting: Obstetrics and Gynecology

## 2022-08-13 ENCOUNTER — Ambulatory Visit (INDEPENDENT_AMBULATORY_CARE_PROVIDER_SITE_OTHER): Payer: Medicare HMO | Admitting: Obstetrics and Gynecology

## 2022-08-13 VITALS — BP 118/64 | Ht 66.0 in | Wt 168.0 lb

## 2022-08-13 DIAGNOSIS — Z1231 Encounter for screening mammogram for malignant neoplasm of breast: Secondary | ICD-10-CM

## 2022-08-13 DIAGNOSIS — A6004 Herpesviral vulvovaginitis: Secondary | ICD-10-CM

## 2022-08-13 DIAGNOSIS — Z1211 Encounter for screening for malignant neoplasm of colon: Secondary | ICD-10-CM

## 2022-08-13 DIAGNOSIS — Z13820 Encounter for screening for osteoporosis: Secondary | ICD-10-CM

## 2022-08-13 DIAGNOSIS — E2839 Other primary ovarian failure: Secondary | ICD-10-CM

## 2022-08-13 DIAGNOSIS — Z01419 Encounter for gynecological examination (general) (routine) without abnormal findings: Secondary | ICD-10-CM

## 2022-08-13 DIAGNOSIS — Z01411 Encounter for gynecological examination (general) (routine) with abnormal findings: Secondary | ICD-10-CM

## 2022-08-13 MED ORDER — VALACYCLOVIR HCL 500 MG PO TABS
500.0000 mg | ORAL_TABLET | Freq: Two times a day (BID) | ORAL | 1 refills | Status: DC
Start: 2022-08-13 — End: 2023-10-13

## 2022-08-13 NOTE — Patient Instructions (Addendum)
I value your feedback and you entrusting us with your care. If you get a [facility] patient survey, I would appreciate you taking the time to let us know about your experience today. Thank you!  [facility]: [phone]      

## 2022-08-21 ENCOUNTER — Other Ambulatory Visit: Payer: Self-pay | Admitting: Obstetrics and Gynecology

## 2022-08-21 DIAGNOSIS — A6004 Herpesviral vulvovaginitis: Secondary | ICD-10-CM

## 2022-09-24 ENCOUNTER — Ambulatory Visit: Payer: Medicare HMO

## 2022-09-24 ENCOUNTER — Other Ambulatory Visit: Payer: Medicare HMO

## 2022-10-24 ENCOUNTER — Ambulatory Visit
Admission: RE | Admit: 2022-10-24 | Discharge: 2022-10-24 | Disposition: A | Discharge status: Home / Self Care | Payer: Medicare HMO | Source: Ambulatory Visit | Attending: Obstetrics and Gynecology | Admitting: Obstetrics and Gynecology

## 2022-10-24 DIAGNOSIS — E2839 Other primary ovarian failure: Secondary | ICD-10-CM | POA: Insufficient documentation

## 2022-10-24 DIAGNOSIS — Z1231 Encounter for screening mammogram for malignant neoplasm of breast: Secondary | ICD-10-CM | POA: Insufficient documentation

## 2022-10-24 DIAGNOSIS — Z13820 Encounter for screening for osteoporosis: Secondary | ICD-10-CM | POA: Insufficient documentation

## 2022-11-29 ENCOUNTER — Ambulatory Visit: Payer: Medicare HMO

## 2022-11-29 DIAGNOSIS — R6884 Jaw pain: Secondary | ICD-10-CM | POA: Diagnosis not present

## 2022-11-29 DIAGNOSIS — R140 Abdominal distension (gaseous): Secondary | ICD-10-CM | POA: Diagnosis not present

## 2022-11-29 DIAGNOSIS — R142 Eructation: Secondary | ICD-10-CM

## 2022-11-29 DIAGNOSIS — Z791 Long term (current) use of non-steroidal anti-inflammatories (NSAID): Secondary | ICD-10-CM

## 2022-11-29 DIAGNOSIS — K219 Gastro-esophageal reflux disease without esophagitis: Secondary | ICD-10-CM | POA: Diagnosis not present

## 2022-11-29 DIAGNOSIS — K5730 Diverticulosis of large intestine without perforation or abscess without bleeding: Secondary | ICD-10-CM | POA: Diagnosis not present

## 2022-11-29 DIAGNOSIS — R1013 Epigastric pain: Secondary | ICD-10-CM | POA: Diagnosis not present

## 2022-11-29 DIAGNOSIS — K640 First degree hemorrhoids: Secondary | ICD-10-CM | POA: Diagnosis not present

## 2022-11-29 DIAGNOSIS — K635 Polyp of colon: Secondary | ICD-10-CM | POA: Diagnosis not present

## 2023-10-13 ENCOUNTER — Other Ambulatory Visit: Payer: Self-pay | Admitting: Obstetrics and Gynecology

## 2023-10-13 DIAGNOSIS — A6004 Herpesviral vulvovaginitis: Secondary | ICD-10-CM

## 2023-10-21 ENCOUNTER — Other Ambulatory Visit: Payer: Self-pay | Admitting: Obstetrics and Gynecology

## 2023-10-21 DIAGNOSIS — A6004 Herpesviral vulvovaginitis: Secondary | ICD-10-CM

## 2023-11-12 ENCOUNTER — Other Ambulatory Visit: Payer: Self-pay | Admitting: Obstetrics and Gynecology

## 2023-11-12 DIAGNOSIS — A6004 Herpesviral vulvovaginitis: Secondary | ICD-10-CM
